# Patient Record
Sex: Female | Born: 1935 | Race: Black or African American | Hispanic: No | Smoking: Never smoker
Health system: Southern US, Community
[De-identification: ages and names within clinical notes are randomized; demographics above are authoritative.]

## PROBLEM LIST (undated history)

## (undated) DIAGNOSIS — E119 Type 2 diabetes mellitus without complications: Secondary | ICD-10-CM

## (undated) DIAGNOSIS — H409 Unspecified glaucoma: Secondary | ICD-10-CM

## (undated) DIAGNOSIS — I2510 Atherosclerotic heart disease of native coronary artery without angina pectoris: Secondary | ICD-10-CM

## (undated) DIAGNOSIS — N289 Disorder of kidney and ureter, unspecified: Secondary | ICD-10-CM

## (undated) DIAGNOSIS — I10 Essential (primary) hypertension: Secondary | ICD-10-CM

## (undated) HISTORY — PX: CORONARY ANGIOPLASTY WITH STENT PLACEMENT: SHX49

---

## 2006-12-22 ENCOUNTER — Ambulatory Visit: Payer: Self-pay | Admitting: Internal Medicine

## 2007-01-11 ENCOUNTER — Ambulatory Visit: Payer: Self-pay | Admitting: Internal Medicine

## 2007-02-10 ENCOUNTER — Ambulatory Visit: Payer: Self-pay | Admitting: Internal Medicine

## 2014-03-06 ENCOUNTER — Other Ambulatory Visit (HOSPITAL_COMMUNITY): Payer: Self-pay | Admitting: Nurse Practitioner

## 2014-03-06 DIAGNOSIS — R109 Unspecified abdominal pain: Secondary | ICD-10-CM

## 2014-03-06 DIAGNOSIS — K920 Hematemesis: Secondary | ICD-10-CM

## 2014-03-13 ENCOUNTER — Ambulatory Visit (HOSPITAL_COMMUNITY)
Admission: RE | Admit: 2014-03-13 | Discharge: 2014-03-13 | Disposition: A | Discharge status: Home / Self Care | Payer: Medicare Other | Source: Ambulatory Visit | Attending: Nurse Practitioner | Admitting: Nurse Practitioner

## 2014-03-13 DIAGNOSIS — N269 Renal sclerosis, unspecified: Secondary | ICD-10-CM | POA: Insufficient documentation

## 2014-03-13 DIAGNOSIS — K920 Hematemesis: Secondary | ICD-10-CM

## 2014-03-13 DIAGNOSIS — R109 Unspecified abdominal pain: Secondary | ICD-10-CM | POA: Insufficient documentation

## 2015-04-17 ENCOUNTER — Inpatient Hospital Stay
Admission: EM | Admit: 2015-04-17 | Discharge: 2015-04-20 | DRG: 392 | Disposition: A | Discharge status: Home / Self Care | Payer: Medicare Other | Attending: Internal Medicine | Admitting: Internal Medicine

## 2015-04-17 ENCOUNTER — Encounter: Payer: Self-pay | Admitting: Urgent Care

## 2015-04-17 ENCOUNTER — Emergency Department: Payer: Medicare Other

## 2015-04-17 DIAGNOSIS — I10 Essential (primary) hypertension: Secondary | ICD-10-CM | POA: Diagnosis present

## 2015-04-17 DIAGNOSIS — K559 Vascular disorder of intestine, unspecified: Secondary | ICD-10-CM | POA: Diagnosis not present

## 2015-04-17 DIAGNOSIS — Z79899 Other long term (current) drug therapy: Secondary | ICD-10-CM

## 2015-04-17 DIAGNOSIS — I2510 Atherosclerotic heart disease of native coronary artery without angina pectoris: Secondary | ICD-10-CM | POA: Diagnosis present

## 2015-04-17 DIAGNOSIS — K625 Hemorrhage of anus and rectum: Secondary | ICD-10-CM | POA: Diagnosis present

## 2015-04-17 DIAGNOSIS — K529 Noninfective gastroenteritis and colitis, unspecified: Principal | ICD-10-CM | POA: Diagnosis present

## 2015-04-17 DIAGNOSIS — K922 Gastrointestinal hemorrhage, unspecified: Secondary | ICD-10-CM

## 2015-04-17 DIAGNOSIS — N390 Urinary tract infection, site not specified: Secondary | ICD-10-CM | POA: Diagnosis present

## 2015-04-17 DIAGNOSIS — Z955 Presence of coronary angioplasty implant and graft: Secondary | ICD-10-CM

## 2015-04-17 DIAGNOSIS — Z7982 Long term (current) use of aspirin: Secondary | ICD-10-CM

## 2015-04-17 DIAGNOSIS — K5731 Diverticulosis of large intestine without perforation or abscess with bleeding: Secondary | ICD-10-CM

## 2015-04-17 DIAGNOSIS — N182 Chronic kidney disease, stage 2 (mild): Secondary | ICD-10-CM | POA: Diagnosis present

## 2015-04-17 DIAGNOSIS — Z882 Allergy status to sulfonamides status: Secondary | ICD-10-CM

## 2015-04-17 DIAGNOSIS — E119 Type 2 diabetes mellitus without complications: Secondary | ICD-10-CM | POA: Diagnosis present

## 2015-04-17 HISTORY — DX: Atherosclerotic heart disease of native coronary artery without angina pectoris: I25.10

## 2015-04-17 HISTORY — DX: Essential (primary) hypertension: I10

## 2015-04-17 HISTORY — DX: Type 2 diabetes mellitus without complications: E11.9

## 2015-04-17 HISTORY — DX: Disorder of kidney and ureter, unspecified: N28.9

## 2015-04-17 LAB — CBC WITH DIFFERENTIAL/PLATELET
Basophils Absolute: 0 10*3/uL (ref 0–0.1)
Basophils Relative: 0 %
EOS ABS: 0 10*3/uL (ref 0–0.7)
EOS PCT: 0 %
HCT: 40.2 % (ref 35.0–47.0)
Hemoglobin: 12.9 g/dL (ref 12.0–16.0)
LYMPHS PCT: 5 %
Lymphs Abs: 0.7 10*3/uL — ABNORMAL LOW (ref 1.0–3.6)
MCH: 27.8 pg (ref 26.0–34.0)
MCHC: 32.1 g/dL (ref 32.0–36.0)
MCV: 86.6 fL (ref 80.0–100.0)
MONO ABS: 0.3 10*3/uL (ref 0.2–0.9)
Monocytes Relative: 2 %
Neutro Abs: 13.4 10*3/uL — ABNORMAL HIGH (ref 1.4–6.5)
Neutrophils Relative %: 93 %
PLATELETS: 175 10*3/uL (ref 150–440)
RBC: 4.64 MIL/uL (ref 3.80–5.20)
RDW: 13.4 % (ref 11.5–14.5)
WBC: 14.5 10*3/uL — ABNORMAL HIGH (ref 3.6–11.0)

## 2015-04-17 LAB — COMPREHENSIVE METABOLIC PANEL
ALT: 32 U/L (ref 14–54)
ANION GAP: 10 (ref 5–15)
AST: 38 U/L (ref 15–41)
Albumin: 3.9 g/dL (ref 3.5–5.0)
Alkaline Phosphatase: 112 U/L (ref 38–126)
BUN: 31 mg/dL — AB (ref 6–20)
CALCIUM: 8.8 mg/dL — AB (ref 8.9–10.3)
CO2: 22 mmol/L (ref 22–32)
CREATININE: 1.55 mg/dL — AB (ref 0.44–1.00)
Chloride: 101 mmol/L (ref 101–111)
GFR, EST AFRICAN AMERICAN: 32 mL/min — AB (ref >60)
GFR, EST NON AFRICAN AMERICAN: 28 mL/min — AB (ref >60)
Glucose, Bld: 277 mg/dL — ABNORMAL HIGH (ref 65–99)
Potassium: 4.2 mmol/L (ref 3.5–5.1)
Sodium: 133 mmol/L — ABNORMAL LOW (ref 135–145)
Total Bilirubin: 0.6 mg/dL (ref 0.3–1.2)
Total Protein: 7.2 g/dL (ref 6.5–8.1)

## 2015-04-17 LAB — TYPE AND SCREEN
ABO/RH(D): O POS
ANTIBODY SCREEN: NEGATIVE

## 2015-04-17 LAB — URINALYSIS COMPLETE WITH MICROSCOPIC (ARMC ONLY)
Bilirubin Urine: NEGATIVE
Glucose, UA: 150 mg/dL — AB
Ketones, ur: NEGATIVE mg/dL
LEUKOCYTES UA: NEGATIVE
NITRITE: NEGATIVE
Protein, ur: 100 mg/dL — AB
Specific Gravity, Urine: 1.006 (ref 1.005–1.030)
Squamous Epithelial / LPF: NONE SEEN
pH: 5 (ref 5.0–8.0)

## 2015-04-17 LAB — LIPASE, BLOOD: Lipase: 33 U/L (ref 22–51)

## 2015-04-17 LAB — TROPONIN I: Troponin I: <0.03 ng/mL (ref <0.031)

## 2015-04-17 MED ORDER — IOHEXOL 240 MG/ML SOLN: 25.0000 mL | Freq: Once | INTRAMUSCULAR | Status: AC | PRN

## 2015-04-17 MED ORDER — SODIUM CHLORIDE 0.9 % IV BOLUS (SEPSIS)
500.0000 mL | Freq: Once | INTRAVENOUS | Status: AC
  Administered 2015-04-18: 500 mL via INTRAVENOUS

## 2015-04-17 MED ORDER — IOHEXOL 300 MG/ML  SOLN
80.0000 mL | Freq: Once | INTRAMUSCULAR | Status: AC | PRN
  Administered 2015-04-17: 80 mL via INTRAVENOUS

## 2015-04-17 NOTE — ED Notes (Signed)
Patient presents with reports of non-specific abdominal pain and passing BRBPR today. Went to [facility], however was not seen due to wait times.

## 2015-04-17 NOTE — ED Provider Notes (Signed)
[facility] Emergency Department Provider Note  ____________________________________________  Time seen: Approximately 10:03 PM  I have reviewed the triage vital signs and the nursing notes.   HISTORY  Chief Complaint Rectal Bleeding and Abdominal Pain    HPI Sonia Simpson is a 81 y.o. female with history of coronary artery disease status post stent on aspirin and Plavix, hypertension, diabetes who presents for evaluation of bright red blood per rectum today, sudden onset, constant. Patient reports that she has seen blood from her rectum at least  8 times today, sometimes mixed with stool and sometimes without stool. Initially she had some lower abdominal cramping this morning but this has resolved. She has had no nausea, vomiting, blood in her vomit, no chest pain or difficulty breathing. Current severity is 8 out of 10. No modifying factors. She has had bleeding from her rectum in the past which was thought to be secondary to polyps.   Past Medical History  Diagnosis Date  . Hypertension   . Diabetes mellitus without complication   . Renal disorder     There are no active problems to display for this patient.   Past Surgical History  Procedure Laterality Date  . Coronary angioplasty with stent placement      2009    No current outpatient prescriptions on file.  Allergies Sulfa antibiotics  No family history on file.  Social History History  Substance Use Topics  . Smoking status: Never Smoker   . Smokeless tobacco: Not on file  . Alcohol Use: No    Review of Systems Constitutional: No fever/chills Eyes: No visual changes. ENT: No sore throat. Cardiovascular: Denies chest pain. Respiratory: Denies shortness of breath. Gastrointestinal: + abdominal cramping.  No nausea, no vomiting.  No diarrhea.  No constipation. Genitourinary: Negative for dysuria. Musculoskeletal: Negative for back pain. Skin: Negative for rash. Neurological:  Negative for headaches, focal weakness or numbness.  10-point ROS otherwise negative.  ____________________________________________   PHYSICAL EXAM:  VITAL SIGNS: ED Triage Vitals  Enc Vitals Group     BP 04/17/15 2055 215/82 mmHg     Pulse Rate 04/17/15 2055 86     Resp 04/17/15 2055 18     Temp 04/17/15 2055 98.3 F (36.8 C)     Temp Source 04/17/15 2055 Oral     SpO2 04/17/15 2055 98 %     Weight 04/17/15 2055 183 lb (83.008 kg)     Height 04/17/15 2055 5\' 8"  (1.727 m)     Head Cir --      Peak Flow --      Pain Score 04/17/15 2056 7     Pain Loc --      Pain Edu? --      Excl. in [place]? --     Constitutional: Alert and oriented. Well appearing and in no acute distress. Eyes: Conjunctivae are normal. PERRL. EOMI. Head: Atraumatic. Nose: No congestion/rhinnorhea. Mouth/Throat: Mucous membranes are moist.  Oropharynx non-erythematous. Neck: No stridor.  Cardiovascular: Normal rate, regular rhythm. Grossly normal heart sounds.  Good peripheral circulation. Respiratory: Normal respiratory effort.  No retractions. Lungs CTAB. Gastrointestinal: Soft with mild lower abdominal tenderness. No distention. No abdominal bruits. No CVA tenderness. Rectal: bright red blood in rectal vault which is strongly guaiac positive Genitourinary: deferred Musculoskeletal: No lower extremity tenderness nor edema.  No joint effusions. Neurologic:  Normal speech and language. No gross focal neurologic deficits are appreciated. Speech is normal. No gait instability. Skin:  Skin is warm,  dry and intact. No rash noted. Psychiatric: Mood and affect are normal. Speech and behavior are normal.  ____________________________________________   LABS (all labs ordered are listed, but only abnormal results are displayed)  Labs Reviewed  CBC WITH DIFFERENTIAL/PLATELET - Abnormal; Notable for the following:    WBC 14.5 (*)    Neutro Abs 13.4 (*)    Lymphs Abs 0.7 (*)    All other components within  normal limits  COMPREHENSIVE METABOLIC PANEL - Abnormal; Notable for the following:    Sodium 133 (*)    Glucose, Bld 277 (*)    BUN 31 (*)    Creatinine, Ser 1.55 (*)    Calcium 8.8 (*)    GFR calc non Af Amer 28 (*)    GFR calc Af Amer 32 (*)    All other components within normal limits  APTT - Abnormal; Notable for the following:    aPTT 23 (*)    All other components within normal limits  LIPASE, BLOOD  TROPONIN I  PROTIME-INR  URINALYSIS COMPLETEWITH MICROSCOPIC ([facility] ONLY)  TYPE AND SCREEN   ____________________________________________  EKG  ED ECG REPORT I, Joanne Gavel, the attending physician, personally viewed and interpreted this ECG.   Date: 04/17/2015  EKG Time: 22:34  Rate: 82  Rhythm: normal sinus rhythm  Axis: normal  Intervals:none  ST&T Change: No acute ST segment elevation, Q waves in lead 3, aVF, V2, V3  ____________________________________________  RADIOLOGY  CT abdomen and pelvis pending ____________________________________________   PROCEDURES  Procedure(s) performed: None  Critical Care performed: No  ____________________________________________   INITIAL IMPRESSION / ASSESSMENT AND PLAN / ED COURSE  Pertinent labs & imaging results that were available during my care of the patient were reviewed by me and considered in my medical decision making (see chart for details).  Sonia Simpson is a 81 y.o. female with history of coronary artery disease status post stent on aspirin and Plavix, hypertension, diabetes who presents for evaluation of bright red blood per rectum. On exam, she is generally well-appearing and in no acute distress. She is mild hypertensive on arrival but vital signs otherwise stable, she is afebrile. She has mild lower abdominal tenderness, normal bowel sounds however she does have active bright red blood in her rectal vault concerning for active lower GI bleed. Plan for basic labs, UA, anticipate  admission.  ----------------------------------------- 10:43 PM on 04/17/2015 -----------------------------------------  Hemoglobin stable but given that the most likely cause of the patient's lower GI brief bleed is diverticular given age, anticipate admission given ongoing bleeding. Given her lower abdominal tenderness and leukocytosis, we'll obtain CT of the abdomen and pelvis at this time. IV fluids infusing.  ----------------------------------------- 11:12 PM on 04/17/2015 -----------------------------------------  Care transferred to Dr. Lovena Le at this time. Anticipate admission. ____________________________________________   FINAL CLINICAL IMPRESSION(S) / ED DIAGNOSES  Final diagnoses:  Lower GI bleed      Joanne Gavel, MD 04/17/15 2312

## 2015-04-18 ENCOUNTER — Encounter: Payer: Self-pay | Admitting: Internal Medicine

## 2015-04-18 DIAGNOSIS — E119 Type 2 diabetes mellitus without complications: Secondary | ICD-10-CM

## 2015-04-18 DIAGNOSIS — Z955 Presence of coronary angioplasty implant and graft: Secondary | ICD-10-CM | POA: Diagnosis not present

## 2015-04-18 DIAGNOSIS — Z882 Allergy status to sulfonamides status: Secondary | ICD-10-CM | POA: Diagnosis not present

## 2015-04-18 DIAGNOSIS — I10 Essential (primary) hypertension: Secondary | ICD-10-CM | POA: Diagnosis present

## 2015-04-18 DIAGNOSIS — N390 Urinary tract infection, site not specified: Secondary | ICD-10-CM | POA: Diagnosis present

## 2015-04-18 DIAGNOSIS — K529 Noninfective gastroenteritis and colitis, unspecified: Secondary | ICD-10-CM | POA: Diagnosis present

## 2015-04-18 DIAGNOSIS — Z79899 Other long term (current) drug therapy: Secondary | ICD-10-CM | POA: Diagnosis not present

## 2015-04-18 DIAGNOSIS — Z7982 Long term (current) use of aspirin: Secondary | ICD-10-CM | POA: Diagnosis not present

## 2015-04-18 DIAGNOSIS — I2510 Atherosclerotic heart disease of native coronary artery without angina pectoris: Secondary | ICD-10-CM

## 2015-04-18 DIAGNOSIS — K625 Hemorrhage of anus and rectum: Secondary | ICD-10-CM | POA: Diagnosis not present

## 2015-04-18 DIAGNOSIS — N182 Chronic kidney disease, stage 2 (mild): Secondary | ICD-10-CM | POA: Diagnosis present

## 2015-04-18 DIAGNOSIS — K559 Vascular disorder of intestine, unspecified: Secondary | ICD-10-CM | POA: Diagnosis present

## 2015-04-18 LAB — CBC
HCT: 37.6 % (ref 35.0–47.0)
HCT: 34.3 % — ABNORMAL LOW (ref 35.0–47.0)
HEMATOCRIT: 34 % — AB (ref 35.0–47.0)
Hemoglobin: 12 g/dL (ref 12.0–16.0)
Hemoglobin: 10.9 g/dL — ABNORMAL LOW (ref 12.0–16.0)
Hemoglobin: 10.9 g/dL — ABNORMAL LOW (ref 12.0–16.0)
MCH: 27.6 pg (ref 26.0–34.0)
MCH: 27.8 pg (ref 26.0–34.0)
MCH: 27.8 pg (ref 26.0–34.0)
MCHC: 31.9 g/dL — ABNORMAL LOW (ref 32.0–36.0)
MCHC: 31.7 g/dL — ABNORMAL LOW (ref 32.0–36.0)
MCHC: 32.1 g/dL (ref 32.0–36.0)
MCV: 86.7 fL (ref 80.0–100.0)
MCV: 87.7 fL (ref 80.0–100.0)
MCV: 86.8 fL (ref 80.0–100.0)
PLATELETS: 141 10*3/uL — AB (ref 150–440)
PLATELETS: 129 10*3/uL — AB (ref 150–440)
Platelets: 118 10*3/uL — ABNORMAL LOW (ref 150–440)
RBC: 4.34 MIL/uL (ref 3.80–5.20)
RBC: 3.91 MIL/uL (ref 3.80–5.20)
RBC: 3.91 MIL/uL (ref 3.80–5.20)
RDW: 13.6 % (ref 11.5–14.5)
RDW: 13.6 % (ref 11.5–14.5)
RDW: 13.6 % (ref 11.5–14.5)
WBC: 13.6 10*3/uL — AB (ref 3.6–11.0)
WBC: 12 10*3/uL — ABNORMAL HIGH (ref 3.6–11.0)
WBC: 12.4 10*3/uL — ABNORMAL HIGH (ref 3.6–11.0)

## 2015-04-18 LAB — GLUCOSE, CAPILLARY
Glucose-Capillary: 139 mg/dL — ABNORMAL HIGH (ref 65–99)
Glucose-Capillary: 183 mg/dL — ABNORMAL HIGH (ref 65–99)
Glucose-Capillary: 234 mg/dL — ABNORMAL HIGH (ref 65–99)
Glucose-Capillary: 238 mg/dL — ABNORMAL HIGH (ref 65–99)
Glucose-Capillary: 277 mg/dL — ABNORMAL HIGH (ref 65–99)
Glucose-Capillary: 295 mg/dL — ABNORMAL HIGH (ref 65–99)

## 2015-04-18 LAB — HEMOGLOBIN AND HEMATOCRIT, BLOOD
HEMATOCRIT: 33.8 % — AB (ref 35.0–47.0)
HEMOGLOBIN: 10.7 g/dL — AB (ref 12.0–16.0)

## 2015-04-18 LAB — BASIC METABOLIC PANEL
ANION GAP: 9 (ref 5–15)
BUN: 27 mg/dL — AB (ref 6–20)
CO2: 21 mmol/L — ABNORMAL LOW (ref 22–32)
CREATININE: 1.39 mg/dL — AB (ref 0.44–1.00)
Calcium: 8.5 mg/dL — ABNORMAL LOW (ref 8.9–10.3)
Chloride: 105 mmol/L (ref 101–111)
GFR calc Af Amer: 37 mL/min — ABNORMAL LOW (ref >60)
GFR calc non Af Amer: 32 mL/min — ABNORMAL LOW (ref >60)
Glucose, Bld: 277 mg/dL — ABNORMAL HIGH (ref 65–99)
Potassium: 4 mmol/L (ref 3.5–5.1)
Sodium: 135 mmol/L (ref 135–145)

## 2015-04-18 LAB — PROTIME-INR
INR: 0.97 (ref 0.00–1.49)
PROTHROMBIN TIME: 13.1 s (ref 11.6–15.2)

## 2015-04-18 LAB — ABO/RH: ABO/RH(D): O POS

## 2015-04-18 LAB — APTT: APTT: 23 s — AB (ref 24–37)

## 2015-04-18 LAB — LACTIC ACID, PLASMA: LACTIC ACID, VENOUS: 1 mmol/L (ref 0.5–2.0)

## 2015-04-18 MED ORDER — HYDRALAZINE HCL 25 MG PO TABS
25.0000 mg | ORAL_TABLET | Freq: Three times a day (TID) | ORAL | Status: DC
  Administered 2015-04-18 – 2015-04-20 (×5): 25 mg via ORAL
  Filled 2015-04-18 (×5): qty 1

## 2015-04-18 MED ORDER — CIPROFLOXACIN IN D5W 400 MG/200ML IV SOLN
400.0000 mg | INTRAVENOUS | Status: DC
  Administered 2015-04-19: 400 mg via INTRAVENOUS
  Filled 2015-04-18 (×2): qty 200

## 2015-04-18 MED ORDER — CIPROFLOXACIN IN D5W 400 MG/200ML IV SOLN
400.0000 mg | Freq: Once | INTRAVENOUS | Status: AC
Start: 2015-04-18 — End: 2015-04-18
  Administered 2015-04-18: 400 mg via INTRAVENOUS

## 2015-04-18 MED ORDER — METRONIDAZOLE IN NACL 5-0.79 MG/ML-% IV SOLN
500.0000 mg | Freq: Three times a day (TID) | INTRAVENOUS | Status: DC
  Administered 2015-04-18 – 2015-04-19 (×5): 500 mg via INTRAVENOUS
  Filled 2015-04-18 (×8): qty 100

## 2015-04-18 MED ORDER — HYDRALAZINE HCL 20 MG/ML IJ SOLN
10.0000 mg | Freq: Once | INTRAMUSCULAR | Status: AC
  Administered 2015-04-18: 10 mg via INTRAVENOUS
  Filled 2015-04-18: qty 1

## 2015-04-18 MED ORDER — PANTOPRAZOLE SODIUM 40 MG IV SOLR
40.0000 mg | Freq: Two times a day (BID) | INTRAVENOUS | Status: DC
Start: 2015-04-18 — End: 2015-04-20
  Administered 2015-04-18 – 2015-04-20 (×5): 40 mg via INTRAVENOUS
  Filled 2015-04-18 (×5): qty 40

## 2015-04-18 MED ORDER — AMLODIPINE BESYLATE 5 MG PO TABS
ORAL_TABLET | ORAL | Status: AC
  Administered 2015-04-18: 5 mg
  Filled 2015-04-18: qty 1

## 2015-04-18 MED ORDER — CIPROFLOXACIN IN D5W 400 MG/200ML IV SOLN
INTRAVENOUS | Status: AC
  Administered 2015-04-18: 400 mg via INTRAVENOUS
  Filled 2015-04-18: qty 200

## 2015-04-18 MED ORDER — INSULIN GLARGINE 100 UNIT/ML ~~LOC~~ SOLN
8.0000 [IU] | Freq: Every day | SUBCUTANEOUS | Status: DC
  Administered 2015-04-18 – 2015-04-20 (×3): 8 [IU] via SUBCUTANEOUS
  Filled 2015-04-18 (×4): qty 0.08

## 2015-04-18 MED ORDER — INSULIN ASPART 100 UNIT/ML ~~LOC~~ SOLN
0.0000 [IU] | SUBCUTANEOUS | Status: DC
  Administered 2015-04-18: 5 [IU] via SUBCUTANEOUS
  Administered 2015-04-18: 8 [IU] via SUBCUTANEOUS
  Administered 2015-04-18: 3 [IU] via SUBCUTANEOUS
  Administered 2015-04-18: 8 [IU] via SUBCUTANEOUS
  Administered 2015-04-18: 5 [IU] via SUBCUTANEOUS
  Administered 2015-04-19: 3 [IU] via SUBCUTANEOUS
  Administered 2015-04-19: 5 [IU] via SUBCUTANEOUS
  Administered 2015-04-19: 8 [IU] via SUBCUTANEOUS
  Administered 2015-04-19: 2 [IU] via SUBCUTANEOUS
  Administered 2015-04-20: 3 [IU] via SUBCUTANEOUS
  Filled 2015-04-18 (×2): qty 8
  Filled 2015-04-18: qty 3
  Filled 2015-04-18: qty 5
  Filled 2015-04-18: qty 8
  Filled 2015-04-18 (×2): qty 5
  Filled 2015-04-18: qty 3
  Filled 2015-04-18: qty 2
  Filled 2015-04-18: qty 3

## 2015-04-18 MED ORDER — METRONIDAZOLE IN NACL 5-0.79 MG/ML-% IV SOLN: 500.0000 mg | Freq: Once | INTRAVENOUS | Status: DC

## 2015-04-18 MED ORDER — ONDANSETRON HCL 4 MG PO TABS: 4.0000 mg | ORAL_TABLET | Freq: Four times a day (QID) | ORAL | Status: DC | PRN

## 2015-04-18 MED ORDER — ONDANSETRON HCL 4 MG/2ML IJ SOLN: 4.0000 mg | Freq: Four times a day (QID) | INTRAMUSCULAR | Status: DC | PRN

## 2015-04-18 NOTE — ED Provider Notes (Addendum)
-----------------------------------------   12:36 AM on 04/18/2015 -----------------------------------------  Patient's CT scan showed colitis which was more favorable of ischemic versus infectious colitis as well as she had extensive diverticulosis. Dr. Reece Levy the hospitalist is going to admit the patient. He did not recommend any particular antibiotics at this time. Patient is still hemodynamically stable.  Ct Abdomen Pelvis W Contrast  04/18/2015   CLINICAL DATA:  81 year old female with abdominal pain and GI bleeding. No indication of diarrhea.  EXAM: CT ABDOMEN AND PELVIS WITH CONTRAST  TECHNIQUE: Multidetector CT imaging of the abdomen and pelvis was performed using the standard protocol following bolus administration of intravenous contrast.  CONTRAST:  31mL OMNIPAQUE IOHEXOL 300 MG/ML  SOLN  COMPARISON:  None.  FINDINGS: Motion degraded study, but overall diagnostic.  BODY WALL: No contributory findings.  LOWER CHEST: No contributory findings.  ABDOMEN/PELVIS:  Liver: No focal abnormality.  Biliary: No evidence of biliary obstruction or stone.  Pancreas: Unremarkable.  Spleen: Unremarkable.  Adrenals: Unremarkable.  Kidneys and ureters: No hydronephrosis or stone.  Bladder: Unremarkable.  Reproductive: 45 mm calcified uterine fibroid.  No adnexal mass.  Bowel: There is circumferential low-density thickening of the colon centered at the level of the splenic flexure with surrounding fat infiltration. No associated obstruction or perforation. Extensive distal colonic diverticulosis. No acute vascular findings such as dissection or major vessel occlusion. No high-grade proximal mesenteric stenosis. Portal venous system is patent.  Retroperitoneum: No mass or adenopathy.  Peritoneum: No ascites or pneumoperitoneum.  Vascular: Atherosclerosis without acute arterial finding or proximal mesenteric stenosis.  OSSEOUS: No acute abnormalities. 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  IMPRESSION:  1. Colitis centered at the splenic flexure, pattern and history favoring ischemic over infectious colitis. 2. Atherosclerosis without acute vascular finding or proximal mesenteric stenosis.   Electronically Signed   By: Monte Fantasia M.D.   On: 04/18/2015 00:03     Sonia Cola, Sonia Simpson 04/18/15 0037  ----------------------------------------- 12:56 AM on 04/18/2015 -----------------------------------------  Patient was started on IV Cipro and Flagyl for her colitis as well as she was found to have a urinary tract infection. Dr. Rexene Edison was consulted to evaluate the patient for questionable ischemic colitis.  Sonia Cola, Sonia Simpson 04/18/15 [phone]

## 2015-04-18 NOTE — Care Management (Signed)
Spoke with patient and niece  who was present in the room. Patient is alert and oriented and stated that the niece and her son help at home . Patient stated that she uses a cane at home but that she also has a walker. She has had home health prior for broken ankle and thinks it was through health department. Patient stated that the son works across the street form her home.  She denies any difficulty at home other than hip pain. Niece confirms. Would recommend to MD PT evaluation when patient is ready due to age and living alone.  No CM needs anticipated at this time. Continue to follow for changing needs.

## 2015-04-18 NOTE — ED Notes (Signed)
Pt up to bathroom with assistance.  Surgeon in with pt   Iv fluids infusing.

## 2015-04-18 NOTE — H&P (Signed)
[facility] [place] NAME: Sonia Simpson    MR#:  [id number]  DATE OF BIRTH:  [birth year]  DATE OF ADMISSION:  04/17/2015  PRIMARY CARE PHYSICIAN: No primary care provider on file. [facility]  REQUESTING/REFERRING PHYSICIAN: Dr. Lovena Le  CHIEF COMPLAINT:   Chief Complaint  Patient presents with  . Rectal Bleeding  . Abdominal Pain    HISTORY OF PRESENT ILLNESS:  Sonia Simpson  is a 81 y.o. female with a known history of hypertension, coronary artery disease, diabetes mellitus type 2 CK D stage 2 presents to the emergency room with the complaints of episodes of bright red blood per rectum 8 since this morning. Associated crampy abdominal pain present but no diarrhea, no vomitings, no hematemesis. Denies any fever, chills, shortness of breath, chest pain, dizziness. Also denies any dysuria, frequency, urgency of urination. In the emergency room patient was evaluated by the ED physician and was found to be stable hemodynamically and workup revealed WBC of 14.5, H&H of 12.9 over 40.2. CT of the abdomen and the pelvis obtained revealed colitis in the splenic flexure favoring ischemic over infectious etiology and evidence of atherosclerosis. Urinalysis unremarkable for UTI. Patient was started on Flagyl and ciprofloxacin and surgery on call was consulted by the ED physician because of possible to ischemic colitis who recommended admission to the medicine and surgery will consult later. Hospitalist service was consulted for further management. Patient is currently comfortable lying in the bed, denies any complaints. She states that she had episodes of bright red blood per rectum last year for which she underwent flex sigmoidoscopy at [facility] and was diagnosed to have polyps. Patient did not have any further lower GI bleed since last year up until today.  PAST MEDICAL HISTORY:   Past Medical History  Diagnosis Date  . Hypertension   . Diabetes  mellitus without complication   . Renal disorder   . Coronary artery disease     PAST SURGICAL HISTORY:   Past Surgical History  Procedure Laterality Date  . Coronary angioplasty with stent placement      2009    SOCIAL HISTORY:   History  Substance Use Topics  . Smoking status: Never Smoker   . Smokeless tobacco: Not on file  . Alcohol Use: No    FAMILY HISTORY:   Family History  Problem Relation Age of Onset  . Diabetes Mellitus II Mother   . Hypertension Father   . Diabetes type II Sister   . Diabetes type II Brother     DRUG ALLERGIES:   Allergies  Allergen Reactions  . Sulfa Antibiotics     REVIEW OF SYSTEMS:   Review of Systems  Constitutional: Negative for fever, chills and malaise/fatigue.  HENT: Negative for ear pain, hearing loss, nosebleeds, sore throat and tinnitus.   Eyes: Negative for blurred vision, double vision, pain, discharge and redness.  Respiratory: Negative for cough, hemoptysis, sputum production, shortness of breath and wheezing.   Cardiovascular: Negative for chest pain, palpitations, orthopnea and leg swelling.  Gastrointestinal: Positive for abdominal pain and blood in stool. Negative for nausea, vomiting, diarrhea, constipation and melena.       Bright red blood per rectum as noted in history of present illness. Mild crampy abdominal pain +.  Genitourinary: Negative for dysuria, urgency, frequency and hematuria.  Musculoskeletal: Negative for back pain, joint pain and neck pain.  Skin: Negative for itching and rash.  Neurological: Negative for dizziness, tingling, sensory change,  focal weakness and seizures.  Endo/Heme/Allergies: Does not bruise/bleed easily.  Psychiatric/Behavioral: Negative for depression. The patient is not nervous/anxious.     MEDICATIONS AT HOME:   Prior to Admission medications   Not on File      VITAL SIGNS:  Blood pressure 210/83, pulse 86, temperature 98.3 F (36.8 C), temperature source Oral,  resp. rate 18, height 5\' 8"  (1.727 m), weight 83.008 kg (183 lb), SpO2 97 %.  PHYSICAL EXAMINATION:  Physical Exam  Constitutional: She is oriented to person, place, and time. She appears well-developed and well-nourished. No distress.  HENT:  Head: Normocephalic and atraumatic.  Right Ear: External ear normal.  Left Ear: External ear normal.  Nose: Nose normal.  Mouth/Throat: Oropharynx is clear and moist. No oropharyngeal exudate.  Eyes: EOM are normal. Pupils are equal, round, and reactive to light. No scleral icterus.  Neck: Normal range of motion. Neck supple. No JVD present. No thyromegaly present.  Cardiovascular: Normal rate, regular rhythm, normal heart sounds and intact distal pulses.  Exam reveals no friction rub.   No murmur heard. Respiratory: Effort normal and breath sounds normal. No respiratory distress. She has no wheezes. She has no rales. She exhibits no tenderness.  GI: Soft. Bowel sounds are normal. She exhibits no distension and no mass. There is no tenderness. There is no rebound and no guarding.  Musculoskeletal: Normal range of motion. She exhibits no edema.  Lymphadenopathy:    She has no cervical adenopathy.  Neurological: She is alert and oriented to person, place, and time. She has normal reflexes. She displays normal reflexes. No cranial nerve deficit. She exhibits normal muscle tone.  Skin: Skin is warm. No rash noted. No erythema.  Psychiatric: She has a normal mood and affect. Her behavior is normal. Thought content normal.   LABORATORY PANEL:   CBC  Recent Labs Lab 04/17/15 2201  WBC 14.5*  HGB 12.9  HCT 40.2  PLT 175   ------------------------------------------------------------------------------------------------------------------  Chemistries   Recent Labs Lab 04/17/15 2201  NA 133*  K 4.2  CL 101  CO2 22  GLUCOSE 277*  BUN 31*  CREATININE 1.55*  CALCIUM 8.8*  AST 38  ALT 32  ALKPHOS 112  BILITOT 0.6    ------------------------------------------------------------------------------------------------------------------  Cardiac Enzymes  Recent Labs Lab 04/17/15 2201  TROPONINI <0.03   ------------------------------------------------------------------------------------------------------------------  RADIOLOGY:  Ct Abdomen Pelvis W Contrast  04/18/2015   CLINICAL DATA:  81 year old female with abdominal pain and GI bleeding. No indication of diarrhea.  EXAM: CT ABDOMEN AND PELVIS WITH CONTRAST  TECHNIQUE: Multidetector CT imaging of the abdomen and pelvis was performed using the standard protocol following bolus administration of intravenous contrast.  CONTRAST:  78mL OMNIPAQUE IOHEXOL 300 MG/ML  SOLN  COMPARISON:  None.  FINDINGS: Motion degraded study, but overall diagnostic.  BODY WALL: No contributory findings.  LOWER CHEST: No contributory findings.  ABDOMEN/PELVIS:  Liver: No focal abnormality.  Biliary: No evidence of biliary obstruction or stone.  Pancreas: Unremarkable.  Spleen: Unremarkable.  Adrenals: Unremarkable.  Kidneys and ureters: No hydronephrosis or stone.  Bladder: Unremarkable.  Reproductive: 45 mm calcified uterine fibroid.  No adnexal mass.  Bowel: There is circumferential low-density thickening of the colon centered at the level of the splenic flexure with surrounding fat infiltration. No associated obstruction or perforation. Extensive distal colonic diverticulosis. No acute vascular findings such as dissection or major vessel occlusion. No high-grade proximal mesenteric stenosis. Portal venous system is patent.  Retroperitoneum: No mass or adenopathy.  Peritoneum: No ascites  or pneumoperitoneum.  Vascular: Atherosclerosis without acute arterial finding or proximal mesenteric stenosis.  OSSEOUS: No acute abnormalities. 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  IMPRESSION: 1. Colitis centered at the splenic flexure, pattern and history favoring ischemic over  infectious colitis. 2. Atherosclerosis without acute vascular finding or proximal mesenteric stenosis.   Electronically Signed   By: Monte Fantasia M.D.   On: 04/18/2015 00:03    EKG:   Orders placed or performed during the hospital encounter of 04/17/15  . ED EKG normal sinus rhythm with ventricular RATE of 82 bpm.   . ED EKG    IMPRESSION AND PLAN:   1. Bright red blood per rectum. 2. Colitis-CT abdomen favors ischemic over infectious. Discussed with the radiologist who opined nonobstructive ischemic etiology. 3. UTI 4. Hypertension, systolic blood pressure mild high. Patient stable. 5. Diabetes mellitus as type II on insulin stable. 6. History of coronary artery disease status post stent, stable, no acute problems. 7. CK D stage II, stable.  Plan: Admit to MedSurg, nothing by mouth except meds, type and screen 2 units of PRBC, monitor serial H&H, IV Protonix, GI consultation requested. Follow-up on surgical consultation which is pending at this time.  -Continue Cipro and Flagyl for now, follow-up urine cultures. -Hold Lantus for now, sliding scale insulin, follow blood sugars. -Continue current antihypertensive medications, follow-up BP measurements. -We'll hold aspirin and Plavix for now because of acute GI bleed. Monitor.    All the records are reviewed and case discussed with ED provider. Management plans discussed with the patient, family and they are in agreement.  CODE STATUS: Full code  TOTAL TIME TAKING CARE OF THIS PATIENT: 50 minutes.    Azucena Freed N M.D on 04/18/2015 at 1:25 AM  Between 7am to 6pm - Pager - [phone]  After 6pm go to [email or web address] - password [email or web address]  [facility]  Office  [phone]  CC: Primary care physician; No primary care provider on file.

## 2015-04-18 NOTE — Progress Notes (Signed)
[facility] [facility] [place] NAME: Taramarie Schnake    MR#:  [id number]  DATE OF BIRTH:  December 14, 1920  SUBJECTIVE:  CHIEF COMPLAINT:  Resting comfortably. Patient reported another episode of bright red blood per rectum today at around 5 AM. Remote history of hemorrhoids in the past  REVIEW OF SYSTEMS:  CONSTITUTIONAL: No fever, fatigue .reporting wekness.  EYES: No blurred or double vision.  EARS, NOSE, AND THROAT: No tinnitus or ear pain.  RESPIRATORY: No cough, shortness of breath, wheezing or hemoptysis.  CARDIOVASCULAR: No chest pain, orthopnea, edema.  GASTROINTESTINAL: No nausea, vomiting, diarrhea or abdominal pain.  GENITOURINARY: No dysuria, hematuria.  ENDOCRINE: No polyuria, nocturia,  HEMATOLOGY: No anemia, easy bruising or bleeding SKIN: No rash or lesion. MUSCULOSKELETAL: No joint pain or arthritis.   NEUROLOGIC: No tingling, numbness, weakness.  PSYCHIATRY: No anxiety or depression.   DRUG ALLERGIES:   Allergies  Allergen Reactions  . Sulfa Antibiotics     VITALS:  Blood pressure 164/57, pulse 79, temperature 97.9 F (36.6 C), temperature source Oral, resp. rate 17, height 5\' 9"  (1.753 m), weight 83.19 kg (183 lb 6.4 oz), SpO2 98 %.  PHYSICAL EXAMINATION:  GENERAL:  81 y.o.-year-old patient lying in the bed with no acute distress.  EYES: Pupils equal, round, reactive to light and accommodation. No scleral icterus. Extraocular muscles intact.  HEENT: Head atraumatic, normocephalic. Oropharynx and nasopharynx clear.  NECK:  Supple, no jugular venous distention. No thyroid enlargement, no tenderness.  LUNGS: Normal breath sounds bilaterally, no wheezing, rales,rhonchi or crepitation. No use of accessory muscles of respiration.  CARDIOVASCULAR: S1, S2 normal. No murmurs, rubs, or gallops.  ABDOMEN: Soft, nontender, nondistended. Bowel sounds present. No organomegaly or mass.  EXTREMITIES: No pedal edema, cyanosis, or  clubbing.  NEUROLOGIC: Cranial nerves II through XII are intact. Muscle strength 5/5 in all extremities. Sensation intact. Gait not checked.  PSYCHIATRIC: The patient is alert and oriented x 3.  SKIN: No obvious rash, lesion, or ulcer.    LABORATORY PANEL:   CBC  Recent Labs Lab 04/18/15 0452  WBC 13.6*  HGB 12.0  HCT 37.6  PLT 141*   ------------------------------------------------------------------------------------------------------------------  Chemistries   Recent Labs Lab 04/17/15 2201 04/18/15 0452  NA 133* 135  K 4.2 4.0  CL 101 105  CO2 22 21*  GLUCOSE 277* 277*  BUN 31* 27*  CREATININE 1.55* 1.39*  CALCIUM 8.8* 8.5*  AST 38  --   ALT 32  --   ALKPHOS 112  --   BILITOT 0.6  --    ------------------------------------------------------------------------------------------------------------------  Cardiac Enzymes  Recent Labs Lab 04/17/15 2201  TROPONINI <0.03   ------------------------------------------------------------------------------------------------------------------  RADIOLOGY:  Ct Abdomen Pelvis W Contrast  04/18/2015   CLINICAL DATA:  81 year old female with abdominal pain and GI bleeding. No indication of diarrhea.  EXAM: CT ABDOMEN AND PELVIS WITH CONTRAST  TECHNIQUE: Multidetector CT imaging of the abdomen and pelvis was performed using the standard protocol following bolus administration of intravenous contrast.  CONTRAST:  59mL OMNIPAQUE IOHEXOL 300 MG/ML  SOLN  COMPARISON:  None.  FINDINGS: Motion degraded study, but overall diagnostic.  BODY WALL: No contributory findings.  LOWER CHEST: No contributory findings.  ABDOMEN/PELVIS:  Liver: No focal abnormality.  Biliary: No evidence of biliary obstruction or stone.  Pancreas: Unremarkable.  Spleen: Unremarkable.  Adrenals: Unremarkable.  Kidneys and ureters: No hydronephrosis or stone.  Bladder: Unremarkable.  Reproductive: 45 mm calcified uterine fibroid.  No adnexal mass.  Bowel:  There is  circumferential low-density thickening of the colon centered at the level of the splenic flexure with surrounding fat infiltration. No associated obstruction or perforation. Extensive distal colonic diverticulosis. No acute vascular findings such as dissection or major vessel occlusion. No high-grade proximal mesenteric stenosis. Portal venous system is patent.  Retroperitoneum: No mass or adenopathy.  Peritoneum: No ascites or pneumoperitoneum.  Vascular: Atherosclerosis without acute arterial finding or proximal mesenteric stenosis.  OSSEOUS: No acute abnormalities. 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  IMPRESSION: 1. Colitis centered at the splenic flexure, pattern and history favoring ischemic over infectious colitis. 2. Atherosclerosis without acute vascular finding or proximal mesenteric stenosis.   Electronically Signed   By: Monte Fantasia M.D.   On: 04/18/2015 00:03    EKG:   Orders placed or performed during the hospital encounter of 04/17/15  . EKG 12-Lead  . EKG 12-Lead    ASSESSMENT AND PLAN:  #. Bright red blood per rectum probably secondary to Colitis-CT abdomen favors ischemic over infectious. Admitting physician has discussed with the radiologist who opined nonobstructive ischemic etiology. Status post 2 units of PRBC.  monitor serial H&H, IV Protonix,  GI consultation requested, discussed with Dr. Jenetta Downer h Follow-up on surgical consultation which is pending at this time.  Clear liquid diet Antibiotics We'll hold aspirin and Plavix for now because of acute GI bleed. Monitor.   #. UTI- on empiric IV antibiotics follow up culture results, #. Hypertension, systolic blood pressure high.  hydralazine is added , titrate as needed basis  #. Diabetes mellitus as type II on insulin. Patient takes 22 units of Lantus subcutaneous nightly daily at bedtime. Currently patient being on clear liquid diet will provide 8 units of Lantus  #. History of coronary artery  disease status post stent, stable, no acute problems.  #CK D stage II, stable.      All the records are reviewed and case discussed with Care Management/Social Workerr. Management plans discussed with the patient, family and they are in agreement.  CODE STATUS: Full code  TOTAL TIME TAKING CARE OF THIS PATIENT: 35 minutes.   POSSIBLE D/C IN 1-2 DAYS, DEPENDING ON CLINICAL CONDITION.   Nicholes Mango M.D on 04/18/2015 at 2:23 PM  Between 7am to 6pm - Pager - [phone] After 6pm go to [email or web address] - password [email or web address] [facility]  [facility]  Office  [phone]  CC: Primary care physician; No primary care provider on file.

## 2015-04-18 NOTE — Consult Note (Signed)
GI Inpatient Consult Note  Reason for Consult: Rectal Bleed    Attending Requesting Consult: Dr. Reece Levy  History of Present Illness: Sonia Simpson is a 81 y.o. female reports she started having pain in her lower abdomen yesterday.  She reports she had a large bowel movement  In the morning, but continued to have stomach pain.  She went to the [facility] and was unable to be seen until early afternoon, so she returned home.  She started to have more bowel movements but when she did it was just blood.  She called the [facility] but was unable to be seen and they recommended she go directly to the emergency room.  In emergency room she had a few more episodes of pure blood bowel movements.  Her last BM was at 5:00 a.m today and she reports that there was "a lot" of blood.  She reports that she had this  same issue year ago.  She was admitted at [facility], during her stay they did a flexible sigmoidoscopy.  The indication at that time was rectal hemorrhage.  The procedure included the endoscope was introduced through the anus and advanced to the left transverse colon the flexible sigmoidoscopy was accomplished without difficulty.  The findings were two sessile polyps were found in the sigmoid colon.  The polyps were 2-3 mm in size.  These were not removed in light of their diminutive size, her current age and the need to go back on aspirin and Plavix.  She also had multiple small and large mouth diverticula were found in the sigmoid colon.  She reports having a colonoscopy 10-15 years ago.  Past Medical History:  Past Medical History  Diagnosis Date  . Hypertension   . Diabetes mellitus without complication   . Renal disorder   . Coronary artery disease     Problem List: Patient Active Problem List   Diagnosis Date Noted  . Bright red rectal bleeding 04/18/2015  . Colitis 04/18/2015  . UTI (urinary tract infection) 04/18/2015  . HTN (hypertension) 04/18/2015  . DM (diabetes  mellitus) 04/18/2015  . CAD (coronary artery disease) 04/18/2015    Past Surgical History: Past Surgical History  Procedure Laterality Date  . Coronary angioplasty with stent placement      2009    Allergies: Allergies  Allergen Reactions  . Sulfa Antibiotics     Home Medications: No prescriptions prior to admission   Home medication reconciliation was completed with the patient.   Scheduled Inpatient Medications:   . [START ON 04/19/2015] ciprofloxacin  400 mg Intravenous Q24H  . insulin aspart  0-15 Units Subcutaneous 6 times per day  . metronidazole  500 mg Intravenous Once  . metronidazole  500 mg Intravenous Q8H  . pantoprazole (PROTONIX) IV  40 mg Intravenous Q12H    Continuous Inpatient Infusions:     PRN Inpatient Medications:  ondansetron **OR** ondansetron (ZOFRAN) IV  Family History: family history includes Diabetes Mellitus II in her mother; Diabetes type II in her brother and sister; Hypertension in her father.  The patient's family history is negative for inflammatory bowel disorders, GI malignancy, or solid organ transplantation.  Social History:   reports that she has never smoked. She does not have any smokeless tobacco history on file. She reports that she does not drink alcohol or use illicit drugs. The patient denies ETOH, tobacco, or drug use.   Review of Systems: Constitutional: Weight is stable.  Eyes: No changes in vision. ENT: No oral  lesions, sore throat.  GI: see HPI.  Heme/Lymph: No easy bruising.  CV: No chest pain.  GU: No hematuria.  Integumentary: No rashes.  Neuro: No headaches.  Psych: No depression/anxiety.  Endocrine: No heat/cold intolerance.  Allergic/Immunologic: No urticaria.  Resp: No cough, SOB.  Musculoskeletal: No joint swelling.    Physical Examination: BP 164/57 mmHg  Pulse 79  Temp(Src) 97.9 F (36.6 C) (Oral)  Resp 17  Ht 5\' 9"  (1.753 m)  Wt 83.19 kg (183 lb 6.4 oz)  BMI 27.07 kg/m2  SpO2 98% Gen: NAD,  alert and oriented x 4, good historian HEENT: PEERLA, EOMI, Neck: supple, no JVD or thyromegaly Chest: CTA bilaterally, no wheezes, crackles, or other adventitious sounds CV: RRR, no m/g/c/r Abd: soft, NT, ND, +BS in all four quadrants; no HSM, guarding, ridigity, or rebound tenderness Ext: no edema, well perfused with 2+ pulses, Skin: no rash or lesions noted Lymph: no LAD Rectal exam: negative without mass, lesions or tenderness, stool guaiac positive.  Data: Lab Results  Component Value Date   WBC 13.6* 04/18/2015   HGB 12.0 04/18/2015   HCT 37.6 04/18/2015   MCV 86.7 04/18/2015   PLT 141* 04/18/2015    Recent Labs Lab 04/17/15 2201 04/18/15 0452  HGB 12.9 12.0   Lab Results  Component Value Date   NA 135 04/18/2015   K 4.0 04/18/2015   CL 105 04/18/2015   CO2 21* 04/18/2015   BUN 27* 04/18/2015   CREATININE 1.39* 04/18/2015   Lab Results  Component Value Date   ALT 32 04/17/2015   AST 38 04/17/2015   ALKPHOS 112 04/17/2015   BILITOT 0.6 04/17/2015    Recent Labs Lab 04/17/15 2201  APTT 23*  INR 0.97   Imaging: CLINICAL DATA: 81 year old female with abdominal pain and GI bleeding. No indication of diarrhea.  EXAM: CT ABDOMEN AND PELVIS WITH CONTRAST  TECHNIQUE: Multidetector CT imaging of the abdomen and pelvis was performed using the standard protocol following bolus administration of intravenous contrast.  CONTRAST: 28mL OMNIPAQUE IOHEXOL 300 MG/ML SOLN  COMPARISON: None.  FINDINGS: Motion degraded study, but overall diagnostic.  BODY WALL: No contributory findings.  LOWER CHEST: No contributory findings.  ABDOMEN/PELVIS:  Liver: No focal abnormality.  Biliary: No evidence of biliary obstruction or stone.  Pancreas: Unremarkable.  Spleen: Unremarkable.  Adrenals: Unremarkable.  Kidneys and ureters: No hydronephrosis or stone.  Bladder: Unremarkable.  Reproductive: 45 mm calcified uterine fibroid. No  adnexal mass.  Bowel: There is circumferential low-density thickening of the colon centered at the level of the splenic flexure with surrounding fat infiltration. No associated obstruction or perforation. Extensive distal colonic diverticulosis. No acute vascular findings such as dissection or major vessel occlusion. No high-grade proximal mesenteric stenosis. Portal venous system is patent.  Retroperitoneum: No mass or adenopathy.  Peritoneum: No ascites or pneumoperitoneum.  Vascular: Atherosclerosis without acute arterial finding or proximal mesenteric stenosis.  OSSEOUS: No acute abnormalities. 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  IMPRESSION: 1. Colitis centered at the splenic flexure, pattern and history favoring ischemic over infectious colitis. 2. Atherosclerosis without acute vascular finding or proximal mesenteric stenosis.   Electronically Signed  By: Monte Fantasia M.D.  Assessment/Plan: Ms. Jaggard is a 81 y.o. female with GI bleed  Recommendations: She had a  flexible sigmoidoscopy at [facility] in September, 2015 for the same type of bleeding episode (please see HPI).  We recommend watchful waiting, and if she experiences more pain or bleeding day and repeat the flexible  sig at that time.  We agree with continue to hold the Plavix. We will continue to follow with you. Thank you for the consult. Please call with questions or concerns.  Salvadore Farber, PA-C  I personally performed these services.

## 2015-04-18 NOTE — Progress Notes (Signed)
Patient seen and examined and chart reviewed. At the present time she is much improved and there does not appear to be any indication for impending surgery. I would agree with the current management. He will not provide a formal consultation in this setting but remain available and follow from a distance. Should she develop further problems which might require intervention we will offer formal consultation.  Our services appreciates opportunity to be involved.

## 2015-04-18 NOTE — Progress Notes (Signed)
Inpatient Diabetes Program Recommendations  AACE/ADA: New Consensus Statement on Inpatient Glycemic Control (2013)  Target Ranges:  Prepandial:   less than 140 mg/dL      Peak postprandial:   less than 180 mg/dL (1-2 hours)      Critically ill patients:  140 - 180 mg/dL   Reason for assessment: elevated CBG   Diabetes history: Type 2 Outpatient Diabetes medications: None Current orders for Inpatient glycemic control: Novolog 0-15 units q6h  Please consider adding low dose Lantus 8 units qhs (0.1unit/kg)- CBG remain elevated despite Novolog correction.   Gentry Fitz, RN, BA, MHA, CDE Diabetes Coordinator Inpatient Diabetes Program  [phone] (Team Pager) [phone] ([place]) 04/18/2015 9:18 AM

## 2015-04-18 NOTE — Consult Note (Signed)
  Pt seen and examined. Please see C. Celesta Aver' notes. Pt with rectal bleeding. Episode similar to last year, when she went to [facility]. Had flex sigmoidoscopy to transverse colon then. 2 tiny polyps seen but not removed due to pt being on ASA/plavix. NO evidence of colitis then. CT now suggests ischemic colitis near splenic flexure. Pt already feeling better. NO more abdominal pain. Abdomen nontender. Minimal bleeding now. Ok to start clears. Since pt had flex sig last year, no need to repeat flex sig as long as pt is asymptomatic. Usually, ischemic colitis resolves on own. Ok to continue Abx for now. Advance diet as tolerated. Will follow. Thanks.

## 2015-04-18 NOTE — Progress Notes (Signed)
ANTIBIOTIC CONSULT NOTE - INITIAL  Pharmacy Consult for Ciprofloxacin Indication: UTI/colitis  Allergies  Allergen Reactions  . Sulfa Antibiotics     Patient Measurements: Height: 5\' 9"  (175.3 cm) Weight: 183 lb 6.4 oz (83.19 kg) IBW/kg (Calculated) : 66.2  Vital Signs: Temp: 97.9 F (36.6 C) (07/07 0240) Temp Source: Oral (07/07 0240) BP: 192/84 mmHg (07/07 0240) Pulse Rate: 84 (07/07 0240) Intake/Output from previous day:   Intake/Output from this shift:    Labs:  Recent Labs  04/17/15 2201  WBC 14.5*  HGB 12.9  PLT 175  CREATININE 1.55*   Estimated Creatinine Clearance: 26.7 mL/min (by C-G formula based on Cr of 1.55). No results for input(s): VANCOTROUGH, VANCOPEAK, VANCORANDOM, GENTTROUGH, GENTPEAK, GENTRANDOM, TOBRATROUGH, TOBRAPEAK, TOBRARND, AMIKACINPEAK, AMIKACINTROU, AMIKACIN in the last 72 hours.   Microbiology: No results found for this or any previous visit (from the past 720 hour(s)).   Urinalysis    Component Value Date/Time   COLORURINE STRAW* 04/17/2015 2241   APPEARANCEUR CLEAR* 04/17/2015 2241   LABSPEC 1.006 04/17/2015 2241   PHURINE 5.0 04/17/2015 2241   GLUCOSEU 150* 04/17/2015 2241   HGBUR 3+* 04/17/2015 2241   [place] 04/17/2015 2241   [place] 04/17/2015 2241   PROTEINUR 100* 04/17/2015 2241   NITRITE NEGATIVE 04/17/2015 2241   LEUKOCYTESUR NEGATIVE 04/17/2015 2241       Medical History: Past Medical History  Diagnosis Date  . Hypertension   . Diabetes mellitus without complication   . Renal disorder   . Coronary artery disease     Medications:  No prescriptions prior to admission   Scheduled:  . [START ON 04/19/2015] ciprofloxacin  400 mg Intravenous Q24H  . insulin aspart  0-15 Units Subcutaneous 6 times per day  . metronidazole  500 mg Intravenous Once  . metronidazole  500 mg Intravenous Q8H  . pantoprazole (PROTONIX) IV  40 mg Intravenous Q12H   Infusions:   PRN: ondansetron **OR**  ondansetron (ZOFRAN) IV  Assessment: Patient admitted with BRBPR, colitis, and UTI ordered ciprofloxacin and metronidazole.   Goal of Therapy:  Resolution of infection  Plan:  Ciprofloxacin 400 mg iv q 24 h. Will f/u culture results.   Ulice Dash D 04/18/2015,2:58 AM

## 2015-04-18 NOTE — Consult Note (Signed)
CC:  Abdominal pain, bright red blood per rectum  HPI:  Sonia Simpson is a pleasant 81 yo F with a history of CAD s/p stent in 2009 on plavix who presents with 1 day of abdominal pain and 1/2 day of BRBPR.  Began acutely last night.  Diffuse but centered in epigastrium and both upper quadrants.  Attempted to go to urgent care but left as was not able to be seen promptly.  Began having 8 episodes of brbpr beginning approx 10 hours ago, last bm 2 hours ago.  Pain minimal now.  No sick contacts but went to a family reunion this weekend and feels that she ate some food which may have been left out a bit long.  No fevers/chills, night sweats, shortness of breath, cough, chest pain, nausea/vomiting, dysuria/hematuria.  Last Colonoscopy was 1 year ago at [facility] which she reports was normal.    Active Ambulatory Problems    Diagnosis Date Noted  . No Active Ambulatory Problems   Resolved Ambulatory Problems    Diagnosis Date Noted  . No Resolved Ambulatory Problems   Past Medical History  Diagnosis Date  . Hypertension   . Diabetes mellitus without complication   . Renal disorder   . Coronary artery disease    Allergies  Allergen Reactions  . Sulfa Antibiotics    History   Social History  . Marital Status: Widowed    Spouse Name: N/A  . Number of Children: N/A  . Years of Education: N/A   Occupational History  . Not on file.   Social History Main Topics  . Smoking status: Never Smoker   . Smokeless tobacco: Not on file  . Alcohol Use: No  . Drug Use: No  . Sexual Activity: Not on file   Other Topics Concern  . Not on file   Social History Narrative   Family History  Problem Relation Age of Onset  . Diabetes Mellitus II Mother   . Hypertension Father   . Diabetes type II Sister   . Diabetes type II Brother    ROS: Full ROS obtained, pertinent positives and negatives as above  Blood pressure 194/78, pulse 92, temperature 98.3 F (36.8 C), temperature  source Oral, resp. rate 20, height 5\' 8"  (1.727 m), weight 83.008 kg (183 lb), SpO2 98 %. GEN: NAD/A&Ox3 FACE: no obvious facial trauma, normal external nose, normal external ears EYES: no scleral icterus, no conjunctivitis HEAD: normocephalic atraumatic CV: RRR, no MRG RESP: moving air well, lungs clear ABD: soft, nontender, nondistended EXT: moving all ext well, strength 5/5 NEURO: cnII-XII grossly intact, sensation intact all 4 ext  Labs: Personally reviewed, pertinent for  WBC 14.5, 93% N Hgb 12.9 Cr 1.55, BUN 31  CT: Personally reviewed, pertinent for - thickening of colon, splenic flexure  A/P 81 yo F who presents with acute onset abdominal pain, brbpr.  Pain and bleeding have subsided.  Suspect ischemic colitis.  Recommend resuscitation, d/c plavix and platelets if continues to bleed.  Would recommend GI consult and possibly tagged RBC scan to isolate bleeding.  No acute surgical issues currently.

## 2015-04-18 NOTE — ED Notes (Signed)
Pt resting quietly.  nsr on monitor. Iv in place.  Skin warm and dry.  Alert.

## 2015-04-19 LAB — GLUCOSE, CAPILLARY
GLUCOSE-CAPILLARY: 70 mg/dL (ref 65–99)
GLUCOSE-CAPILLARY: 129 mg/dL — AB (ref 65–99)
Glucose-Capillary: 130 mg/dL — ABNORMAL HIGH (ref 65–99)
Glucose-Capillary: 245 mg/dL — ABNORMAL HIGH (ref 65–99)
Glucose-Capillary: 196 mg/dL — ABNORMAL HIGH (ref 65–99)
Glucose-Capillary: 272 mg/dL — ABNORMAL HIGH (ref 65–99)

## 2015-04-19 LAB — CBC
HCT: 32.5 % — ABNORMAL LOW (ref 35.0–47.0)
Hemoglobin: 10.3 g/dL — ABNORMAL LOW (ref 12.0–16.0)
MCH: 27.5 pg (ref 26.0–34.0)
MCHC: 31.6 g/dL — ABNORMAL LOW (ref 32.0–36.0)
MCV: 87.1 fL (ref 80.0–100.0)
PLATELETS: 116 10*3/uL — AB (ref 150–440)
RBC: 3.74 MIL/uL — AB (ref 3.80–5.20)
RDW: 13.6 % (ref 11.5–14.5)
WBC: 12.7 10*3/uL — ABNORMAL HIGH (ref 3.6–11.0)

## 2015-04-19 LAB — HEMOGLOBIN AND HEMATOCRIT, BLOOD
HCT: 34 % — ABNORMAL LOW (ref 35.0–47.0)
HEMOGLOBIN: 10.9 g/dL — AB (ref 12.0–16.0)

## 2015-04-19 MED ORDER — SACCHAROMYCES BOULARDII 250 MG PO CAPS
250.0000 mg | ORAL_CAPSULE | Freq: Two times a day (BID) | ORAL | Status: DC
  Administered 2015-04-19 – 2015-04-20 (×3): 250 mg via ORAL
  Filled 2015-04-19 (×4): qty 1

## 2015-04-19 MED ORDER — CIPROFLOXACIN HCL 500 MG PO TABS
500.0000 mg | ORAL_TABLET | Freq: Every day | ORAL | Status: DC
  Administered 2015-04-20: 500 mg via ORAL
  Filled 2015-04-19: qty 1

## 2015-04-19 MED ORDER — METRONIDAZOLE 500 MG PO TABS
500.0000 mg | ORAL_TABLET | Freq: Three times a day (TID) | ORAL | Status: DC
  Administered 2015-04-19 – 2015-04-20 (×3): 500 mg via ORAL
  Filled 2015-04-19 (×3): qty 1

## 2015-04-19 NOTE — Progress Notes (Signed)
[facility] [place] NAME: Normalee Fickes    MR#:  [id number]  DATE OF BIRTH:  11-22-1920  SUBJECTIVE:  CHIEF COMPLAINT:  Resting comfortably. Denies any abdominal pain. No other episodes of GI bleed .Remote history of hemorrhoids in the past  REVIEW OF SYSTEMS:  CONSTITUTIONAL: No fever, fatigue .reporting wekness.  EYES: No blurred or double vision.  EARS, NOSE, AND THROAT: No tinnitus or ear pain.  RESPIRATORY: No cough, shortness of breath, wheezing or hemoptysis.  CARDIOVASCULAR: No chest pain, orthopnea, edema.  GASTROINTESTINAL: No nausea, vomiting, diarrhea or abdominal pain.  GENITOURINARY: No dysuria, hematuria.  ENDOCRINE: No polyuria, nocturia,  HEMATOLOGY: No anemia, easy bruising or bleeding SKIN: No rash or lesion. MUSCULOSKELETAL: No joint pain or arthritis.   NEUROLOGIC: No tingling, numbness, weakness.  PSYCHIATRY: No anxiety or depression.   DRUG ALLERGIES:   Allergies  Allergen Reactions  . Sulfa Antibiotics     VITALS:  Blood pressure 135/62, pulse 61, temperature 98.5 F (36.9 C), temperature source Oral, resp. rate 16, height 5\' 9"  (1.753 m), weight 85.866 kg (189 lb 4.8 oz), SpO2 99 %.  PHYSICAL EXAMINATION:  GENERAL:  81 y.o.-year-old patient lying in the bed with no acute distress.  EYES: Pupils equal, round, reactive to light and accommodation. No scleral icterus. Extraocular muscles intact.  HEENT: Head atraumatic, normocephalic. Oropharynx and nasopharynx clear.  NECK:  Supple, no jugular venous distention. No thyroid enlargement, no tenderness.  LUNGS: Normal breath sounds bilaterally, no wheezing, rales,rhonchi or crepitation. No use of accessory muscles of respiration.  CARDIOVASCULAR: S1, S2 normal. No murmurs, rubs, or gallops.  ABDOMEN: Soft, nontender, nondistended. Bowel sounds present. No organomegaly or mass.  EXTREMITIES: No pedal edema, cyanosis, or clubbing.  NEUROLOGIC: Cranial  nerves II through XII are intact. Muscle strength 5/5 in all extremities. Sensation intact. Gait not checked.  PSYCHIATRIC: The patient is alert and oriented x 3.  SKIN: No obvious rash, lesion, or ulcer.    LABORATORY PANEL:   CBC  Recent Labs Lab 04/19/15 0311  WBC 12.7*  HGB 10.3*  HCT 32.5*  PLT 116*   ------------------------------------------------------------------------------------------------------------------  Chemistries   Recent Labs Lab 04/17/15 2201 04/18/15 0452  NA 133* 135  K 4.2 4.0  CL 101 105  CO2 22 21*  GLUCOSE 277* 277*  BUN 31* 27*  CREATININE 1.55* 1.39*  CALCIUM 8.8* 8.5*  AST 38  --   ALT 32  --   ALKPHOS 112  --   BILITOT 0.6  --    ------------------------------------------------------------------------------------------------------------------  Cardiac Enzymes  Recent Labs Lab 04/17/15 2201  TROPONINI <0.03   ------------------------------------------------------------------------------------------------------------------  RADIOLOGY:  Ct Abdomen Pelvis W Contrast  04/18/2015   CLINICAL DATA:  81 year old female with abdominal pain and GI bleeding. No indication of diarrhea.  EXAM: CT ABDOMEN AND PELVIS WITH CONTRAST  TECHNIQUE: Multidetector CT imaging of the abdomen and pelvis was performed using the standard protocol following bolus administration of intravenous contrast.  CONTRAST:  63mL OMNIPAQUE IOHEXOL 300 MG/ML  SOLN  COMPARISON:  None.  FINDINGS: Motion degraded study, but overall diagnostic.  BODY WALL: No contributory findings.  LOWER CHEST: No contributory findings.  ABDOMEN/PELVIS:  Liver: No focal abnormality.  Biliary: No evidence of biliary obstruction or stone.  Pancreas: Unremarkable.  Spleen: Unremarkable.  Adrenals: Unremarkable.  Kidneys and ureters: No hydronephrosis or stone.  Bladder: Unremarkable.  Reproductive: 45 mm calcified uterine fibroid.  No adnexal mass.  Bowel: There is circumferential low-density  thickening of the colon centered at the level of the splenic flexure with surrounding fat infiltration. No associated obstruction or perforation. Extensive distal colonic diverticulosis. No acute vascular findings such as dissection or major vessel occlusion. No high-grade proximal mesenteric stenosis. Portal venous system is patent.  Retroperitoneum: No mass or adenopathy.  Peritoneum: No ascites or pneumoperitoneum.  Vascular: Atherosclerosis without acute arterial finding or proximal mesenteric stenosis.  OSSEOUS: No acute abnormalities. 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  IMPRESSION: 1. Colitis centered at the splenic flexure, pattern and history favoring ischemic over infectious colitis. 2. Atherosclerosis without acute vascular finding or proximal mesenteric stenosis.   Electronically Signed   By: Monte Fantasia M.D.   On: 04/18/2015 00:03    EKG:   Orders placed or performed during the hospital encounter of 04/17/15  . EKG 12-Lead  . EKG 12-Lead    ASSESSMENT AND PLAN:  #. Bright red blood per rectum probably secondary to Colitis-CT abdomen favors ischemic over infectious. Admitting physician has discussed with the radiologist who opined nonobstructive ischemic etiology. Status post 2 units of PRBC.  monitor serial H&H, IV Protonix,  GI consultation requested, discussed with Dr. Jenetta Downer h, recommended to continue antibiotics and advance diet as tolerated. No surgical recommendations by surgical consultation , recommends to continue Antibiotics We'll hold aspirin and Plavix for now because of acute GI bleed. Monitor.   #. UTI- on empiric IV antibiotics follow up culture results, #. Hypertension, systolic blood pressure high.  hydralazine is added , titrate as needed basis  #. Diabetes mellitus as type II on insulin. Patient takes 22 units of Lantus subcutaneous nightly daily at bedtime. Currently patient being on clear liquid diet will provide 8 units of Lantus  #.  History of coronary artery disease status post stent, stable, no acute problems.  #CK D stage II, stable. Getting better #Weakness/with history of hip arthritis-PT consult for deconditioning  Disposition based on PT recommendations  Plan of care discussed and the patient and her daughter Ms.Leeroy Bock over the phone- agreeable with the current plan of care      All the records are reviewed and case discussed with Care Management/Social Workerr. Management plans discussed with the patient, family and they are in agreement.  CODE STATUS: Full code  TOTAL TIME TAKING CARE OF THIS PATIENT: 35 minutes.   POSSIBLE D/C IN am  , DEPENDING ON CLINICAL CONDITION.   Nicholes Mango M.D on 04/19/2015 at 1:03 PM  Between 7am to 6pm - Pager - [phone] After 6pm go to [email or web address] - password [email or web address] [facility]  [facility]  Office  [phone]  CC: Primary care physician; No primary care provider on file.

## 2015-04-19 NOTE — Evaluation (Signed)
Physical Therapy Evaluation Patient Details Name: Sonia Simpson MRN: [id number] DOB: Nov 26, 1920 Today's Date: 04/19/2015   History of Present Illness  Pt was admitted to the hospital with bouts of bloody diarrhea and colitis   Clinical Impression  Pt presents with hx of HTN, DM, renal disorder, and CAD. Examination reveals that pt is able to perform bed mobility and transfers at full independence. She ambulates 400 ft in 2 bouts at supervision (for safety within the hospital) with a single point cane. No observed LOBs or significant gait deviations. Pt performed 8 steps up/down at [facility] for safety within the hospital. She stated no fatigue and demonstrated proper and safe navigation of the stairs with one hand rail and her cane. She has 3 stairs at home. Pt appears to have no physical deficits and is at her baseline level of physical function. Pt agreed with this, and also agreed that she needs no further PT. Pt will therefore be d/c'd in house and will no longer receive the services of PT. PT orders will be completed.     Follow Up Recommendations No PT follow up    Equipment Recommendations  None recommended by PT    Recommendations for Other Services       Precautions / Restrictions Restrictions Weight Bearing Restrictions: No      Mobility  Bed Mobility Overal bed mobility: Independent             General bed mobility comments: Needs no assistance  Transfers Overall transfer level: Independent (Does not need cane for succesful transfer) Equipment used: None             General transfer comment: Needs no assistance  Ambulation/Gait Ambulation/Gait assistance: Modified independent (Device/Increase time);Supervision Ambulation Distance (Feet): 400 Feet Assistive device: Straight cane Gait Pattern/deviations: WFL(Within Functional Limits)     General Gait Details: Pt able to perform 400 ft of ambulation in 2 sets. She stated no fatigue. At the end of the 2nd set  she navigated up/down 8 steps  Stairs Stairs: Yes (8 steps up/down) Stairs assistance: Min guard (for safety within the hospital) Stair Management: One rail Left Number of Stairs: 8 General stair comments: able to safely navigate up/down with no safety concern by PT  Wheelchair Mobility    Modified Rankin (Stroke Patients Only)       Balance Overall balance assessment: No apparent balance deficits (not formally assessed)                                           Pertinent Vitals/Pain Pain Assessment: No/denies pain    Home Living Family/patient expects to be discharged to:: Private residence Living Arrangements: Alone (Son lives down the street) Available Help at Discharge: Family;Friend(s);Neighbor Type of Home: House Home Access: Stairs to enter Entrance Stairs-Rails: Can reach both Entrance Stairs-Number of Steps: 3 Home Layout: Able to live on main level with bedroom/bathroom;One level Home Equipment: Cane - single point Additional Comments: Pt states no need for additional DME    Prior Function Level of Independence: Independent with assistive device(s)         Comments: Pt able to perform all household ambulation at mod I with cane. She states she typically fatigues after 250 - 300 ft.      Hand Dominance        Extremity/Trunk Assessment   Upper Extremity Assessment: Overall WFL for tasks  assessed           Lower Extremity Assessment: Overall WFL for tasks assessed         Communication   Communication: No difficulties  Cognition Arousal/Alertness: Awake/alert Behavior During Therapy: WFL for tasks assessed/performed Overall Cognitive Status: Within Functional Limits for tasks assessed                      General Comments      Exercises        Assessment/Plan    PT Assessment Patent does not need any further PT services  PT Diagnosis Other (comment) (No apparent physical deficits )   PT Problem List     PT Treatment Interventions     PT Goals (Current goals can be found in the Care Plan section) Acute Rehab PT Goals Patient Stated Goal: to return home PT Goal Formulation: With patient Time For Goal Achievement: 05/03/15 Potential to Achieve Goals: Good    Frequency     Barriers to discharge        Co-evaluation               End of Session Equipment Utilized During Treatment: Gait belt Activity Tolerance: Patient tolerated treatment well Patient left: in chair;with call bell/phone within reach;with family/visitor present Nurse Communication: Mobility status         Time: [id number] PT Time Calculation (min) (ACUTE ONLY): 31 min   Charges:         PT G CodesJanyth Contes 2015/04/26, 4:11 PM  Janyth Contes, SPT. [id number]

## 2015-04-19 NOTE — Consult Note (Signed)
  GI Inpatient Follow-up Note  Patient Identification: Sonia Simpson is a 81 y.o. female  Subjective: No abdominal pain. No rectal bleeding. Started solids for lunch. So far, ok.  Scheduled Inpatient Medications:  . [START ON 04/20/2015] ciprofloxacin  500 mg Oral Daily  . hydrALAZINE  25 mg Oral 3 times per day  . insulin aspart  0-15 Units Subcutaneous 6 times per day  . insulin glargine  8 Units Subcutaneous Daily  . metronidazole  500 mg Intravenous Once  . metroNIDAZOLE  500 mg Oral 3 times per day  . pantoprazole (PROTONIX) IV  40 mg Intravenous Q12H  . saccharomyces boulardii  250 mg Oral BID    Continuous Inpatient Infusions:     PRN Inpatient Medications:  ondansetron **OR** ondansetron (ZOFRAN) IV  Review of Systems: Constitutional: Weight is stable.  Eyes: No changes in vision. ENT: No oral lesions, sore throat.  GI: see HPI.  Heme/Lymph: No easy bruising.  CV: No chest pain.  GU: No hematuria.  Integumentary: No rashes.  Neuro: No headaches.  Psych: No depression/anxiety.  Endocrine: No heat/cold intolerance.  Allergic/Immunologic: No urticaria.  Resp: No cough, SOB.  Musculoskeletal: No joint swelling.    Physical Examination: BP 128/48 mmHg  Pulse 59  Temp(Src) 98.5 F (36.9 C) (Oral)  Resp 18  Ht 5\' 9"  (1.753 m)  Wt 85.866 kg (189 lb 4.8 oz)  BMI 27.94 kg/m2  SpO2 100% Gen: NAD, alert and oriented x 4 HEENT: PEERLA, EOMI, Neck: supple, no JVD or thyromegaly Chest: CTA bilaterally, no wheezes, crackles, or other adventitious sounds CV: RRR, no m/g/c/r Abd: soft, NT, ND, +BS in all four quadrants; no HSM, guarding, ridigity, or rebound tenderness Ext: no edema, well perfused with 2+ pulses, Skin: no rash or lesions noted Lymph: no LAD  Data: Lab Results  Component Value Date   WBC 12.7* 04/19/2015   HGB 10.9* 04/19/2015   HCT 34.0* 04/19/2015   MCV 87.1 04/19/2015   PLT 116* 04/19/2015    Recent Labs Lab 04/18/15 2015  04/19/15 0311 04/19/15 1338  HGB 10.9* 10.3* 10.9*   Lab Results  Component Value Date   NA 135 04/18/2015   K 4.0 04/18/2015   CL 105 04/18/2015   CO2 21* 04/18/2015   BUN 27* 04/18/2015   CREATININE 1.39* 04/18/2015   Lab Results  Component Value Date   ALT 32 04/17/2015   AST 38 04/17/2015   ALKPHOS 112 04/17/2015   BILITOT 0.6 04/17/2015    Recent Labs Lab 04/17/15 2201  APTT 23*  INR 0.97   Assessment/Plan: Ms. Clevinger is a 81 y.o. female with probable ischemic colitis. Clinically stable.  Recommendations: If tolerates solids rest of today, ok for discharge by tomorrow. Will sign off. Call GI on call if condition changes. thanks Please call with questions or concerns.  Falon Flinchum, Lupita Dawn, MD

## 2015-04-20 LAB — CBC
HCT: 31.8 % — ABNORMAL LOW (ref 35.0–47.0)
HEMOGLOBIN: 10.2 g/dL — AB (ref 12.0–16.0)
MCH: 27.9 pg (ref 26.0–34.0)
MCHC: 32.1 g/dL (ref 32.0–36.0)
MCV: 86.8 fL (ref 80.0–100.0)
Platelets: 106 10*3/uL — ABNORMAL LOW (ref 150–440)
RBC: 3.66 MIL/uL — ABNORMAL LOW (ref 3.80–5.20)
RDW: 13.5 % (ref 11.5–14.5)
WBC: 8 10*3/uL (ref 3.6–11.0)

## 2015-04-20 LAB — URINE CULTURE: Culture: 80000

## 2015-04-20 LAB — GLUCOSE, CAPILLARY
Glucose-Capillary: 99 mg/dL (ref 65–99)
Glucose-Capillary: 197 mg/dL — ABNORMAL HIGH (ref 65–99)

## 2015-04-20 LAB — HEMOGLOBIN AND HEMATOCRIT, BLOOD
HEMATOCRIT: 34.6 % — AB (ref 35.0–47.0)
Hemoglobin: 11.3 g/dL — ABNORMAL LOW (ref 12.0–16.0)

## 2015-04-20 MED ORDER — PRAZIQUANTEL POWD: 850.0000 mg | Freq: Once | Status: DC

## 2015-04-20 MED ORDER — INSULIN GLARGINE 100 UNIT/ML ~~LOC~~ SOLN: 8.0000 [IU] | Freq: Every day | SUBCUTANEOUS | Status: DC

## 2015-04-20 MED ORDER — CIPROFLOXACIN HCL 500 MG PO TABS
500.0000 mg | ORAL_TABLET | Freq: Two times a day (BID) | ORAL | Status: DC
Start: 2015-04-20 — End: 2017-06-06

## 2015-04-20 MED ORDER — METRONIDAZOLE 500 MG PO TABS
500.0000 mg | ORAL_TABLET | Freq: Three times a day (TID) | ORAL | Status: DC
Start: 2015-04-20 — End: 2017-06-06

## 2015-04-20 MED ORDER — CIPROFLOXACIN HCL 500 MG PO TABS: 500.0000 mg | ORAL_TABLET | Freq: Every day | ORAL | Status: DC

## 2015-04-20 MED ORDER — CEPHALEXIN 500 MG PO CAPS: 500.0000 mg | ORAL_CAPSULE | Freq: Two times a day (BID) | ORAL | Status: DC

## 2015-04-20 MED ORDER — HYDRALAZINE HCL 25 MG PO TABS: 25.0000 mg | ORAL_TABLET | Freq: Three times a day (TID) | ORAL | Status: DC

## 2015-04-20 MED ORDER — SACCHAROMYCES BOULARDII 250 MG PO CAPS: 250.0000 mg | ORAL_CAPSULE | Freq: Two times a day (BID) | ORAL | Status: AC

## 2015-04-20 NOTE — Discharge Summary (Addendum)
[facility] NAME: Sonia Simpson    MR#:  [id number]  DATE OF BIRTH:  March 30, 1921  DATE OF ADMISSION:  04/17/2015 ADMITTING PHYSICIAN: Juluis Mire, MD  DATE OF DISCHARGE: 04/20/2015 11:25 AM  PRIMARY CARE PHYSICIAN: 04/20/2015  ADMISSION DIAGNOSIS:  Colitis, ischemic [K55.9] Lower GI bleed [K92.2] UTI (lower urinary tract infection) [N39.0] Diverticulosis of large intestine with hemorrhage [K57.31]  DISCHARGE DIAGNOSIS:  Principal Problem:   Ischemic colitis Active Problems:   Colitis   UTI (urinary tract infection)   HTN (hypertension)   DM (diabetes mellitus)   CAD (coronary artery disease)   SECONDARY DIAGNOSIS:   Past Medical History  Diagnosis Date  . Hypertension   . Diabetes mellitus without complication   . Renal disorder   . Coronary artery disease     HOSPITAL COURSE:  #. Bright red blood per rectum probably secondary to Colitis-CT abdomen favors ischemic over infectious. GI consultation requested, discussed with Dr. Jenetta Downer h, recommended to continue antibiotics and advance diet as tolerated. No surgical recommendations by surgical consultation , recommends to continue Antibiotics ciprofloxacin and Flagyl We'll hold aspirin and Plavix for now because of acute GI bleed. Monitor. PCP to consider resuming during follow-up visit Son is concerned for tape warm infestation- stool for Ova and parasites is ordered, will treat empirically with praziquantel one-time dose    #. UTI- with Escherichia coli sensitive to cefazolin, will treat with by mouth Keflex 500 mg by mouth every 12 hours for 7 days #. Hypertension, systolic blood pressure high. hydralazine is added , titrate as needed basis  #. Diabetes mellitus as type II on insulin. Patient takes 22 units of Lantus subcutaneous nightly daily at bedtime. Currently will provide 8 units of Lantus , PCP can consider resuming Lantus if patient is stable #. History  of coronary artery disease status post stent, stable, no acute problems. #CK D stage II, stable.  #Weakness/with history of hip arthritis-PT consult for deconditioning- no new recommendations    DISCHARGE CONDITIONS:   Satisfactory  CONSULTS OBTAINED:  Treatment Team:  Marlyce Huge, MD Nicholes Mango, MD Hulen Luster, MD Dia Crawford III, MD   PROCEDURES none  DRUG ALLERGIES:   Allergies  Allergen Reactions  . Sulfa Antibiotics     DISCHARGE MEDICATIONS:   Discharge Medication List as of 04/20/2015 11:07 AM    START taking these medications   Details  insulin glargine (LANTUS) 100 UNIT/ML injection Inject 0.08 mLs (8 Units total) into the skin daily., Starting 04/20/2015, Until Discontinued, Print    metroNIDAZOLE (FLAGYL) 500 MG tablet Take 1 tablet (500 mg total) by mouth 3 (three) times daily., Starting 04/20/2015, Until Discontinued, Print    Praziquantel POWD Take 850 mg by mouth once., Starting 04/20/2015, Print    saccharomyces boulardii (FLORASTOR) 250 MG capsule Take 1 capsule (250 mg total) by mouth 2 (two) times daily., Starting 04/20/2015, Until Discontinued, Print      CONTINUE these medications which have CHANGED   Details  ciprofloxacin (CIPRO) 500 MG tablet Take 1 tablet (500 mg total) by mouth 2 (two) times daily., Starting 04/20/2015, Until Discontinued, Print    hydrALAZINE (APRESOLINE) 25 MG tablet Take 1 tablet (25 mg total) by mouth every 8 (eight) hours., Starting 04/20/2015, Until Discontinued, Print      CONTINUE these medications which have NOT CHANGED   Details  aspirin 81 MG tablet Take 81 mg by mouth daily., Until Discontinued, Historical Med  CARVEDILOL PO Take by mouth 2 (two) times daily., Until Discontinued, Historical Med    clopidogrel (PLAVIX) 75 MG tablet Take 75 mg by mouth daily., Until Discontinued, Historical Med    LISINOPRIL PO Take by mouth daily., Until Discontinued, Historical Med         DISCHARGE INSTRUCTIONS:    Follow-up with primary care physician in a week Follow-up with gastroenterology when necessary  DIET:  Healthy heart, ADA  DISCHARGE CONDITION:  Fair  ACTIVITY:  Activity as tolerated  OXYGEN:  Home Oxygen: No.   Oxygen Delivery: room air  DISCHARGE LOCATION:  home   If you experience worsening of your admission symptoms, develop shortness of breath, life threatening emergency, suicidal or homicidal thoughts you must seek medical attention immediately by calling 911 or calling your MD immediately  if symptoms less severe.  You Must read complete instructions/literature along with all the possible adverse reactions/side effects for all the Medicines you take and that have been prescribed to you. Take any new Medicines after you have completely understood and accpet all the possible adverse reactions/side effects.   Please note  You were cared for by a hospitalist during your hospital stay. If you have any questions about your discharge medications or the care you received while you were in the hospital after you are discharged, you can call the unit and asked to speak with the hospitalist on call if the hospitalist that took care of you is not available. Once you are discharged, your primary care physician will handle any further medical issues. Please note that NO REFILLS for any discharge medications will be authorized once you are discharged, as it is imperative that you return to your primary care physician (or establish a relationship with a primary care physician if you do not have one) for your aftercare needs so that they can reassess your need for medications and monitor your lab values.     Today  Chief Complaint  Patient presents with  . Rectal Bleeding  . Abdominal Pain     ROS: none CONSTITUTIONAL: Denies fevers, chills. Denies any fatigue, weakness.  EYES: Denies blurry vision, double vision, eye pain. EARS, NOSE, THROAT: Denies tinnitus, ear pain, hearing  loss. RESPIRATORY: Denies cough, wheeze, shortness of breath.  CARDIOVASCULAR: Denies chest pain, palpitations, edema.  GASTROINTESTINAL: Denies nausea, vomiting, diarrhea, abdominal pain. Denies bright red blood per rectum. GENITOURINARY: Denies dysuria, hematuria. ENDOCRINE: Denies nocturia or thyroid problems. HEMATOLOGIC AND LYMPHATIC: Denies easy bruising or bleeding. SKIN: Denies rash or lesion. MUSCULOSKELETAL: Denies pain in neck, back, shoulder, knees, hips or arthritic symptoms.  NEUROLOGIC: Denies paralysis, paresthesias.  PSYCHIATRIC: Denies anxiety or depressive symptoms.   VITAL SIGNS:  Blood pressure 146/51, pulse 65, temperature 97.8 F (36.6 C), temperature source Oral, resp. rate 18, height 5\' 9"  (1.753 m), weight 85.911 kg (189 lb 6.4 oz), SpO2 99 %.  I/O:    Intake/Output Summary (Last 24 hours) at 04/20/15 1426 Last data filed at 04/20/15 0900  Gross per 24 hour  Intake    240 ml  Output   1650 ml  Net  -1410 ml    PHYSICAL EXAMINATION:  GENERAL:  81 y.o.-year-old patient lying in the bed with no acute distress.  EYES: Pupils equal, round, reactive to light and accommodation. No scleral icterus. Extraocular muscles intact.  HEENT: Head atraumatic, normocephalic. Oropharynx and nasopharynx clear.  NECK:  Supple, no jugular venous distention. No thyroid enlargement, no tenderness.  LUNGS: Normal breath sounds bilaterally, no wheezing, rales,rhonchi  or crepitation. No use of accessory muscles of respiration.  CARDIOVASCULAR: S1, S2 normal. No murmurs, rubs, or gallops.  ABDOMEN: Soft, non-tender, non-distended. Bowel sounds present. No organomegaly or mass.  EXTREMITIES: No pedal edema, cyanosis, or clubbing.  NEUROLOGIC: Cranial nerves II through XII are intact. Muscle strength 5/5 in all extremities. Sensation intact. Gait not checked.  PSYCHIATRIC: The patient is alert and oriented x 3.  SKIN: No obvious rash, lesion, or ulcer.   DATA REVIEW:    CBC  Recent Labs Lab 04/20/15 0650  WBC 8.0  HGB 10.2*  HCT 31.8*  PLT 106*    Chemistries   Recent Labs Lab 04/17/15 2201 04/18/15 0452  NA 133* 135  K 4.2 4.0  CL 101 105  CO2 22 21*  GLUCOSE 277* 277*  BUN 31* 27*  CREATININE 1.55* 1.39*  CALCIUM 8.8* 8.5*  AST 38  --   ALT 32  --   ALKPHOS 112  --   BILITOT 0.6  --     Cardiac Enzymes  Recent Labs Lab 04/17/15 2201  TROPONINI <0.03    Microbiology Results  Results for orders placed or performed during the hospital encounter of 04/17/15  Urine culture     Status: None   Collection Time: 04/17/15 10:01 PM  Result Value Ref Range Status   Specimen Description URINE, RANDOM  Final   Special Requests NONE  Final   Culture   Final    80,000 COLONIES/ml ESCHERICHIA COLI WITH MIXED BACTERIAL ORGANISMS    Report Status 04/20/2015 FINAL  Final   Organism ID, Bacteria ESCHERICHIA COLI  Final      Susceptibility   Escherichia coli - MIC*    AMPICILLIN 8 SENSITIVE Sensitive     CEFTAZIDIME <=1 SENSITIVE Sensitive     CEFAZOLIN <=4 SENSITIVE Sensitive     CEFTRIAXONE <=1 SENSITIVE Sensitive     CIPROFLOXACIN >=4 RESISTANT Resistant     GENTAMICIN <=1 SENSITIVE Sensitive     IMIPENEM <=0.25 SENSITIVE Sensitive     TRIMETH/SULFA <=20 SENSITIVE Sensitive     * 80,000 COLONIES/ml ESCHERICHIA COLI    RADIOLOGY:  Ct Abdomen Pelvis W Contrast  04/18/2015   CLINICAL DATA:  81 year old female with abdominal pain and GI bleeding. No indication of diarrhea.  EXAM: CT ABDOMEN AND PELVIS WITH CONTRAST  TECHNIQUE: Multidetector CT imaging of the abdomen and pelvis was performed using the standard protocol following bolus administration of intravenous contrast.  CONTRAST:  69mL OMNIPAQUE IOHEXOL 300 MG/ML  SOLN  COMPARISON:  None.  FINDINGS: Motion degraded study, but overall diagnostic.  BODY WALL: No contributory findings.  LOWER CHEST: No contributory findings.  ABDOMEN/PELVIS:  Liver: No focal abnormality.  Biliary:  No evidence of biliary obstruction or stone.  Pancreas: Unremarkable.  Spleen: Unremarkable.  Adrenals: Unremarkable.  Kidneys and ureters: No hydronephrosis or stone.  Bladder: Unremarkable.  Reproductive: 45 mm calcified uterine fibroid.  No adnexal mass.  Bowel: There is circumferential low-density thickening of the colon centered at the level of the splenic flexure with surrounding fat infiltration. No associated obstruction or perforation. Extensive distal colonic diverticulosis. No acute vascular findings such as dissection or major vessel occlusion. No high-grade proximal mesenteric stenosis. Portal venous system is patent.  Retroperitoneum: No mass or adenopathy.  Peritoneum: No ascites or pneumoperitoneum.  Vascular: Atherosclerosis without acute arterial finding or proximal mesenteric stenosis.  OSSEOUS: No acute abnormalities. 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  IMPRESSION: 1. Colitis centered at the splenic flexure, pattern and history  favoring ischemic over infectious colitis. 2. Atherosclerosis without acute vascular finding or proximal mesenteric stenosis.   Electronically Signed   By: Monte Fantasia M.D.   On: 04/18/2015 00:03    EKG:   Orders placed or performed during the hospital encounter of 04/17/15  . EKG 12-Lead  . EKG 12-Lead      Management plans discussed with the patient, family and they are in agreement.  CODE STATUS:     Code Status Orders        Start     Ordered   04/18/15 0250  Full code   Continuous     04/18/15 0249    Advance Directive Documentation        Most Recent Value   Type of Advance Directive  Living will [at patient's home]   Pre-existing out of facility DNR order (yellow form or pink MOST form)     "MOST" Form in Place?        TOTAL TIME TAKING CARE OF THIS PATIENT: 45 minutes.    @[facility] @  on 04/20/2015 at 2:26 PM  Between 7am to 6pm - Pager - [phone]  After 6pm go to [email or web address] - password [email or web address]  [facility]  [facility]  Office  [phone]  CC: Primary care physician; No primary care provider on file.  Addendum- missed giving prescription Keflex. Called son Mr. Cockram and left a voicemail to pick up prescription from the floor. RN Raquel Sarna notified, saved Keflex prescription to the chart.

## 2015-04-20 NOTE — Discharge Instructions (Signed)
*  Notify your doctor if your symptoms return or become worse.  *Take all medications as prescribed.  *Take your antibiotics until complete even if you are feeling better.  *Notify your doctor with any questions or concerns.

## 2015-04-20 NOTE — Progress Notes (Signed)
A&O. VSS. Tolerating diet well. No complaints of pain or nausea. No stool. Resting comfortably. Discharged per MD orders. Discharge instructions reviewed with pt and son. Pt verbalized understanding. IV's removed per policy. Discharged via wheelchair escorted by nursing staff.

## 2015-05-01 ENCOUNTER — Other Ambulatory Visit: Payer: Self-pay | Admitting: Family Medicine

## 2015-05-01 DIAGNOSIS — R600 Localized edema: Secondary | ICD-10-CM

## 2015-05-02 ENCOUNTER — Ambulatory Visit
Admission: RE | Admit: 2015-05-02 | Discharge: 2015-05-02 | Disposition: A | Discharge status: Home / Self Care | Payer: Medicare Other | Source: Ambulatory Visit | Attending: Family Medicine | Admitting: Family Medicine

## 2015-05-02 DIAGNOSIS — R600 Localized edema: Secondary | ICD-10-CM | POA: Insufficient documentation

## 2016-01-23 ENCOUNTER — Other Ambulatory Visit: Payer: Self-pay | Admitting: Family Medicine

## 2016-01-23 DIAGNOSIS — G459 Transient cerebral ischemic attack, unspecified: Secondary | ICD-10-CM

## 2016-01-27 ENCOUNTER — Ambulatory Visit
Admission: RE | Admit: 2016-01-27 | Discharge: 2016-01-27 | Disposition: A | Discharge status: Home / Self Care | Payer: Medicare Other | Source: Ambulatory Visit | Attending: Family Medicine | Admitting: Family Medicine

## 2016-01-27 DIAGNOSIS — G319 Degenerative disease of nervous system, unspecified: Secondary | ICD-10-CM | POA: Insufficient documentation

## 2016-01-27 DIAGNOSIS — I739 Peripheral vascular disease, unspecified: Secondary | ICD-10-CM | POA: Insufficient documentation

## 2016-01-27 DIAGNOSIS — J3489 Other specified disorders of nose and nasal sinuses: Secondary | ICD-10-CM | POA: Diagnosis not present

## 2016-01-27 DIAGNOSIS — I6521 Occlusion and stenosis of right carotid artery: Secondary | ICD-10-CM | POA: Insufficient documentation

## 2016-01-27 DIAGNOSIS — G459 Transient cerebral ischemic attack, unspecified: Secondary | ICD-10-CM

## 2016-01-27 DIAGNOSIS — I638 Other cerebral infarction: Secondary | ICD-10-CM | POA: Insufficient documentation

## 2016-01-27 DIAGNOSIS — I10 Essential (primary) hypertension: Secondary | ICD-10-CM | POA: Diagnosis not present

## 2016-01-27 DIAGNOSIS — I2510 Atherosclerotic heart disease of native coronary artery without angina pectoris: Secondary | ICD-10-CM | POA: Insufficient documentation

## 2016-01-27 DIAGNOSIS — M4802 Spinal stenosis, cervical region: Secondary | ICD-10-CM | POA: Insufficient documentation

## 2017-06-02 ENCOUNTER — Emergency Department: Payer: Medicare Other

## 2017-06-02 ENCOUNTER — Encounter: Payer: Self-pay | Admitting: Emergency Medicine

## 2017-06-02 ENCOUNTER — Inpatient Hospital Stay
Admission: EM | Admit: 2017-06-02 | Discharge: 2017-06-06 | DRG: 371 | Disposition: A | Discharge status: Home / Self Care | Payer: Medicare Other | Attending: Internal Medicine | Admitting: Internal Medicine

## 2017-06-02 DIAGNOSIS — N184 Chronic kidney disease, stage 4 (severe): Secondary | ICD-10-CM | POA: Diagnosis present

## 2017-06-02 DIAGNOSIS — I129 Hypertensive chronic kidney disease with stage 1 through stage 4 chronic kidney disease, or unspecified chronic kidney disease: Secondary | ICD-10-CM | POA: Diagnosis present

## 2017-06-02 DIAGNOSIS — R197 Diarrhea, unspecified: Secondary | ICD-10-CM

## 2017-06-02 DIAGNOSIS — D631 Anemia in chronic kidney disease: Secondary | ICD-10-CM | POA: Diagnosis present

## 2017-06-02 DIAGNOSIS — A0472 Enterocolitis due to Clostridium difficile, not specified as recurrent: Secondary | ICD-10-CM | POA: Diagnosis present

## 2017-06-02 DIAGNOSIS — R159 Full incontinence of feces: Secondary | ICD-10-CM | POA: Diagnosis present

## 2017-06-02 DIAGNOSIS — I2510 Atherosclerotic heart disease of native coronary artery without angina pectoris: Secondary | ICD-10-CM | POA: Diagnosis present

## 2017-06-02 DIAGNOSIS — N179 Acute kidney failure, unspecified: Secondary | ICD-10-CM

## 2017-06-02 DIAGNOSIS — Z8249 Family history of ischemic heart disease and other diseases of the circulatory system: Secondary | ICD-10-CM

## 2017-06-02 DIAGNOSIS — Z7902 Long term (current) use of antithrombotics/antiplatelets: Secondary | ICD-10-CM | POA: Diagnosis not present

## 2017-06-02 DIAGNOSIS — Z794 Long term (current) use of insulin: Secondary | ICD-10-CM | POA: Diagnosis not present

## 2017-06-02 DIAGNOSIS — Z79899 Other long term (current) drug therapy: Secondary | ICD-10-CM | POA: Diagnosis not present

## 2017-06-02 DIAGNOSIS — Z7982 Long term (current) use of aspirin: Secondary | ICD-10-CM | POA: Diagnosis not present

## 2017-06-02 DIAGNOSIS — H409 Unspecified glaucoma: Secondary | ICD-10-CM | POA: Diagnosis present

## 2017-06-02 DIAGNOSIS — E1122 Type 2 diabetes mellitus with diabetic chronic kidney disease: Secondary | ICD-10-CM | POA: Diagnosis present

## 2017-06-02 DIAGNOSIS — N170 Acute kidney failure with tubular necrosis: Secondary | ICD-10-CM | POA: Diagnosis present

## 2017-06-02 DIAGNOSIS — Z882 Allergy status to sulfonamides status: Secondary | ICD-10-CM | POA: Diagnosis not present

## 2017-06-02 DIAGNOSIS — Z955 Presence of coronary angioplasty implant and graft: Secondary | ICD-10-CM

## 2017-06-02 DIAGNOSIS — Z833 Family history of diabetes mellitus: Secondary | ICD-10-CM | POA: Diagnosis not present

## 2017-06-02 HISTORY — DX: Unspecified glaucoma: H40.9

## 2017-06-02 LAB — COMPREHENSIVE METABOLIC PANEL
ALBUMIN: 3.5 g/dL (ref 3.5–5.0)
ALT: 23 U/L (ref 14–54)
AST: 31 U/L (ref 15–41)
Alkaline Phosphatase: 71 U/L (ref 38–126)
Anion gap: 9 (ref 5–15)
BUN: 54 mg/dL — ABNORMAL HIGH (ref 6–20)
CALCIUM: 8.6 mg/dL — AB (ref 8.9–10.3)
CHLORIDE: 110 mmol/L (ref 101–111)
CO2: 20 mmol/L — AB (ref 22–32)
Creatinine, Ser: 3.02 mg/dL — ABNORMAL HIGH (ref 0.44–1.00)
GFR calc Af Amer: 14 mL/min — ABNORMAL LOW (ref >60)
GFR, EST NON AFRICAN AMERICAN: 12 mL/min — AB (ref >60)
Glucose, Bld: 214 mg/dL — ABNORMAL HIGH (ref 65–99)
POTASSIUM: 4.5 mmol/L (ref 3.5–5.1)
Sodium: 139 mmol/L (ref 135–145)
TOTAL PROTEIN: 6.4 g/dL — AB (ref 6.5–8.1)
Total Bilirubin: 0.7 mg/dL (ref 0.3–1.2)

## 2017-06-02 LAB — GASTROINTESTINAL PANEL BY PCR, STOOL (REPLACES STOOL CULTURE)
ADENOVIRUS F40/41: NOT DETECTED
ASTROVIRUS: NOT DETECTED
CAMPYLOBACTER SPECIES: NOT DETECTED
CYCLOSPORA CAYETANENSIS: NOT DETECTED
Cryptosporidium: NOT DETECTED
ENTEROPATHOGENIC E COLI (EPEC): NOT DETECTED
ENTEROTOXIGENIC E COLI (ETEC): NOT DETECTED
Entamoeba histolytica: NOT DETECTED
Enteroaggregative E coli (EAEC): NOT DETECTED
Giardia lamblia: NOT DETECTED
Norovirus GI/GII: NOT DETECTED
PLESIMONAS SHIGELLOIDES: NOT DETECTED
ROTAVIRUS A: NOT DETECTED
Salmonella species: NOT DETECTED
Sapovirus (I, II, IV, and V): NOT DETECTED
Shiga like toxin producing E coli (STEC): NOT DETECTED
Shigella/Enteroinvasive E coli (EIEC): NOT DETECTED
VIBRIO SPECIES: NOT DETECTED
Vibrio cholerae: NOT DETECTED
Yersinia enterocolitica: NOT DETECTED

## 2017-06-02 LAB — CBC
HCT: 35.1 % (ref 35.0–47.0)
HEMOGLOBIN: 11.5 g/dL — AB (ref 12.0–16.0)
MCH: 28 pg (ref 26.0–34.0)
MCHC: 32.6 g/dL (ref 32.0–36.0)
MCV: 85.8 fL (ref 80.0–100.0)
PLATELETS: 165 10*3/uL (ref 150–440)
RBC: 4.1 MIL/uL (ref 3.80–5.20)
RDW: 14.1 % (ref 11.5–14.5)
WBC: 9.7 10*3/uL (ref 3.6–11.0)

## 2017-06-02 LAB — LIPASE, BLOOD: LIPASE: 38 U/L (ref 11–51)

## 2017-06-02 LAB — APTT: APTT: 34 s (ref 24–36)

## 2017-06-02 LAB — AMMONIA: Ammonia: 30 umol/L (ref 9–35)

## 2017-06-02 LAB — LACTIC ACID, PLASMA
Lactic Acid, Venous: 1.2 mmol/L (ref 0.5–1.9)
Lactic Acid, Venous: 0.9 mmol/L (ref 0.5–1.9)

## 2017-06-02 LAB — C DIFFICILE QUICK SCREEN W PCR REFLEX
C DIFFICLE (CDIFF) ANTIGEN: POSITIVE — AB
C Diff toxin: NEGATIVE

## 2017-06-02 LAB — GLUCOSE, CAPILLARY: GLUCOSE-CAPILLARY: 211 mg/dL — AB (ref 65–99)

## 2017-06-02 LAB — CLOSTRIDIUM DIFFICILE BY PCR: Toxigenic C. Difficile by PCR: POSITIVE — AB

## 2017-06-02 LAB — PROTIME-INR
INR: 1.31
Prothrombin Time: 16.4 seconds — ABNORMAL HIGH (ref 11.4–15.2)

## 2017-06-02 LAB — MAGNESIUM: MAGNESIUM: 1.9 mg/dL (ref 1.7–2.4)

## 2017-06-02 MED ORDER — HEPARIN SODIUM (PORCINE) 5000 UNIT/ML IJ SOLN
5000.0000 [IU] | Freq: Three times a day (TID) | INTRAMUSCULAR | Status: DC
  Administered 2017-06-02 – 2017-06-06 (×11): 5000 [IU] via SUBCUTANEOUS
  Filled 2017-06-02 (×11): qty 1

## 2017-06-02 MED ORDER — TIMOLOL MALEATE 0.5 % OP SOLN
1.0000 [drp] | Freq: Two times a day (BID) | OPHTHALMIC | Status: DC
  Administered 2017-06-02 – 2017-06-06 (×9): 1 [drp] via OPHTHALMIC
  Filled 2017-06-02: qty 5

## 2017-06-02 MED ORDER — FERROUS SULFATE 325 (65 FE) MG PO TABS
325.0000 mg | ORAL_TABLET | Freq: Two times a day (BID) | ORAL | Status: DC
  Administered 2017-06-03 – 2017-06-06 (×7): 325 mg via ORAL
  Filled 2017-06-02 (×6): qty 1

## 2017-06-02 MED ORDER — MORPHINE SULFATE (PF) 2 MG/ML IV SOLN
2.0000 mg | Freq: Once | INTRAVENOUS | Status: AC
  Administered 2017-06-02: 2 mg via INTRAVENOUS
  Filled 2017-06-02: qty 1

## 2017-06-02 MED ORDER — CIPROFLOXACIN IN D5W 400 MG/200ML IV SOLN
400.0000 mg | INTRAVENOUS | Status: DC
  Administered 2017-06-02: 400 mg via INTRAVENOUS
  Filled 2017-06-02 (×2): qty 200

## 2017-06-02 MED ORDER — SODIUM CHLORIDE 0.9 % IV SOLN
INTRAVENOUS | Status: DC
  Administered 2017-06-03: 21:00:00 via INTRAVENOUS

## 2017-06-02 MED ORDER — AMLODIPINE BESYLATE 5 MG PO TABS
10.0000 mg | ORAL_TABLET | Freq: Every day | ORAL | Status: DC
  Administered 2017-06-03 – 2017-06-06 (×3): 10 mg via ORAL
  Filled 2017-06-02 (×4): qty 2

## 2017-06-02 MED ORDER — ACETAMINOPHEN 325 MG PO TABS: 650.0000 mg | ORAL_TABLET | Freq: Four times a day (QID) | ORAL | Status: DC | PRN

## 2017-06-02 MED ORDER — TRAZODONE HCL 50 MG PO TABS: 25.0000 mg | ORAL_TABLET | Freq: Every evening | ORAL | Status: DC | PRN

## 2017-06-02 MED ORDER — HYDRALAZINE HCL 50 MG PO TABS
50.0000 mg | ORAL_TABLET | Freq: Two times a day (BID) | ORAL | Status: DC
  Administered 2017-06-02 – 2017-06-06 (×5): 50 mg via ORAL
  Filled 2017-06-02 (×6): qty 1

## 2017-06-02 MED ORDER — PRAVASTATIN SODIUM 80 MG PO TABS
80.0000 mg | ORAL_TABLET | Freq: Every day | ORAL | Status: DC
  Administered 2017-06-03 – 2017-06-06 (×5): 80 mg via ORAL
  Filled 2017-06-02 (×4): qty 1

## 2017-06-02 MED ORDER — CLONIDINE HCL 0.3 MG/24HR TD PTWK
0.3000 mg | MEDICATED_PATCH | TRANSDERMAL | Status: DC
  Filled 2017-06-02: qty 1

## 2017-06-02 MED ORDER — METRONIDAZOLE 500 MG PO TABS
500.0000 mg | ORAL_TABLET | Freq: Three times a day (TID) | ORAL | Status: DC
Start: 2017-06-02 — End: 2017-06-02

## 2017-06-02 MED ORDER — SODIUM CHLORIDE 0.9 % IV BOLUS (SEPSIS)
500.0000 mL | INTRAVENOUS | Status: AC
  Administered 2017-06-02: 500 mL via INTRAVENOUS

## 2017-06-02 MED ORDER — GLIPIZIDE 2.5 MG HALF TABLET
2.5000 mg | ORAL_TABLET | Freq: Two times a day (BID) | ORAL | Status: DC
  Administered 2017-06-02 – 2017-06-04 (×4): 2.5 mg via ORAL
  Filled 2017-06-02 (×5): qty 1

## 2017-06-02 MED ORDER — ACETAMINOPHEN 650 MG RE SUPP: 650.0000 mg | Freq: Four times a day (QID) | RECTAL | Status: DC | PRN

## 2017-06-02 MED ORDER — LINAGLIPTIN 5 MG PO TABS
5.0000 mg | ORAL_TABLET | Freq: Every day | ORAL | Status: DC
  Administered 2017-06-03 – 2017-06-06 (×5): 5 mg via ORAL
  Filled 2017-06-02 (×4): qty 1

## 2017-06-02 MED ORDER — ONDANSETRON HCL 4 MG/2ML IJ SOLN: 4.0000 mg | Freq: Four times a day (QID) | INTRAMUSCULAR | Status: DC | PRN

## 2017-06-02 MED ORDER — ONDANSETRON HCL 4 MG/2ML IJ SOLN
4.0000 mg | INTRAMUSCULAR | Status: AC
  Administered 2017-06-02: 4 mg via INTRAVENOUS
  Filled 2017-06-02: qty 2

## 2017-06-02 MED ORDER — CARVEDILOL 25 MG PO TABS
25.0000 mg | ORAL_TABLET | Freq: Two times a day (BID) | ORAL | Status: DC
  Administered 2017-06-02 – 2017-06-06 (×6): 25 mg via ORAL
  Filled 2017-06-02 (×8): qty 1

## 2017-06-02 MED ORDER — ONDANSETRON HCL 4 MG PO TABS: 4.0000 mg | ORAL_TABLET | Freq: Four times a day (QID) | ORAL | Status: DC | PRN

## 2017-06-02 MED ORDER — FAMOTIDINE 20 MG PO TABS
10.0000 mg | ORAL_TABLET | Freq: Every day | ORAL | Status: DC
  Administered 2017-06-02 – 2017-06-06 (×5): 10 mg via ORAL
  Filled 2017-06-02 (×5): qty 1

## 2017-06-02 MED ORDER — ASPIRIN EC 81 MG PO TBEC
81.0000 mg | DELAYED_RELEASE_TABLET | Freq: Every day | ORAL | Status: DC
  Administered 2017-06-03 – 2017-06-04 (×2): 81 mg via ORAL
  Filled 2017-06-02 (×2): qty 1

## 2017-06-02 MED ORDER — CLOPIDOGREL BISULFATE 75 MG PO TABS
75.0000 mg | ORAL_TABLET | Freq: Every day | ORAL | Status: DC
  Administered 2017-06-03 – 2017-06-04 (×2): 75 mg via ORAL
  Filled 2017-06-02 (×2): qty 1

## 2017-06-02 MED ORDER — VANCOMYCIN 50 MG/ML ORAL SOLUTION
125.0000 mg | Freq: Four times a day (QID) | ORAL | Status: DC
  Administered 2017-06-03 (×2): 125 mg via ORAL
  Filled 2017-06-02 (×4): qty 2.5

## 2017-06-02 MED ORDER — SODIUM CHLORIDE 0.9 % IV SOLN: Freq: Once | INTRAVENOUS | Status: DC

## 2017-06-02 MED ORDER — SACCHAROMYCES BOULARDII 250 MG PO CAPS
250.0000 mg | ORAL_CAPSULE | Freq: Two times a day (BID) | ORAL | Status: DC
  Administered 2017-06-02 – 2017-06-06 (×8): 250 mg via ORAL
  Filled 2017-06-02 (×9): qty 1

## 2017-06-02 MED ORDER — INSULIN ASPART 100 UNIT/ML ~~LOC~~ SOLN
0.0000 [IU] | Freq: Three times a day (TID) | SUBCUTANEOUS | Status: DC
  Administered 2017-06-03: 3 [IU] via SUBCUTANEOUS
  Filled 2017-06-02: qty 1

## 2017-06-02 NOTE — Progress Notes (Signed)
ANTIBIOTIC CONSULT NOTE - INITIAL  Pharmacy Consult for Ciprofloxacin  Indication: intra-abdominal infection  Allergies  Allergen Reactions  . Sulfa Antibiotics     Patient Measurements: Height: 5\' 8"  (172.7 cm) Weight: 190 lb (86.2 kg) IBW/kg (Calculated) : 63.9 Adjusted Body Weight:   Vital Signs: Temp: 98.8 F (37.1 C) (08/22 2011) Temp Source: Oral (08/22 1417) BP: 151/54 (08/22 2011) Pulse Rate: 73 (08/22 2011) Intake/Output from previous day: No intake/output data recorded. Intake/Output from this shift: No intake/output data recorded.  Labs:  Recent Labs  06/02/17 1459  WBC 9.7  HGB 11.5*  PLT 165  CREATININE 3.02*   Estimated Creatinine Clearance: 13.1 mL/min (A) (by C-G formula based on SCr of 3.02 mg/dL (H)). No results for input(s): VANCOTROUGH, VANCOPEAK, VANCORANDOM, GENTTROUGH, GENTPEAK, GENTRANDOM, TOBRATROUGH, TOBRAPEAK, TOBRARND, AMIKACINPEAK, AMIKACINTROU, AMIKACIN in the last 72 hours.   Microbiology: Recent Results (from the past 720 hour(s))  C difficile quick scan w PCR reflex     Status: Abnormal   Collection Time: 06/02/17  2:59 PM  Result Value Ref Range Status   C Diff antigen POSITIVE (A) NEGATIVE Final   C Diff toxin NEGATIVE NEGATIVE Final   C Diff interpretation Results are indeterminate. See PCR results.  Final  Gastrointestinal Panel by PCR , Stool     Status: None   Collection Time: 06/02/17  2:59 PM  Result Value Ref Range Status   Campylobacter species NOT DETECTED NOT DETECTED Final   Plesimonas shigelloides NOT DETECTED NOT DETECTED Final   Salmonella species NOT DETECTED NOT DETECTED Final   Yersinia enterocolitica NOT DETECTED NOT DETECTED Final   Vibrio species NOT DETECTED NOT DETECTED Final   Vibrio cholerae NOT DETECTED NOT DETECTED Final   Enteroaggregative E coli (EAEC) NOT DETECTED NOT DETECTED Final   Enteropathogenic E coli (EPEC) NOT DETECTED NOT DETECTED Final   Enterotoxigenic E coli (ETEC) NOT DETECTED NOT  DETECTED Final   Shiga like toxin producing E coli (STEC) NOT DETECTED NOT DETECTED Final   Shigella/Enteroinvasive E coli (EIEC) NOT DETECTED NOT DETECTED Final   Cryptosporidium NOT DETECTED NOT DETECTED Final   Cyclospora cayetanensis NOT DETECTED NOT DETECTED Final   Entamoeba histolytica NOT DETECTED NOT DETECTED Final   Giardia lamblia NOT DETECTED NOT DETECTED Final   Adenovirus F40/41 NOT DETECTED NOT DETECTED Final   Astrovirus NOT DETECTED NOT DETECTED Final   Norovirus GI/GII NOT DETECTED NOT DETECTED Final   Rotavirus A NOT DETECTED NOT DETECTED Final   Sapovirus (I, II, IV, and V) NOT DETECTED NOT DETECTED Final  Clostridium Difficile by PCR     Status: Abnormal   Collection Time: 06/02/17  2:59 PM  Result Value Ref Range Status   Toxigenic C Difficile by pcr POSITIVE (A) NEGATIVE Final    Comment: Positive for toxigenic C. difficile with little to no toxin production. Only treat if clinical presentation suggests symptomatic illness.    Medical History: Past Medical History:  Diagnosis Date  . Coronary artery disease   . Diabetes mellitus without complication ([place])   . Glaucoma   . Hypertension   . Renal disorder     Medications:  Scheduled:  . [START ON 06/03/2017] amLODipine  10 mg Oral Daily  . [START ON 06/03/2017] aspirin EC  81 mg Oral Daily  . carvedilol  25 mg Oral BID  . cloNIDine  0.3 mg Transdermal Weekly  . [START ON 06/03/2017] clopidogrel  75 mg Oral Daily  . famotidine  10 mg Oral Daily  .  ferrous sulfate  325 mg Oral BID WC  . glipiZIDE  2.5 mg Oral BID AC  . heparin  5,000 Units Subcutaneous Q8H  . hydrALAZINE  50 mg Oral BID  . [START ON 06/03/2017] insulin aspart  0-9 Units Subcutaneous TID WC  . [START ON 06/03/2017] linagliptin  5 mg Oral Daily  . [START ON 06/03/2017] pravastatin  80 mg Oral Daily  . saccharomyces boulardii  250 mg Oral BID  . timolol  1 drop Both Eyes BID  . vancomycin  125 mg Oral Q6H   Assessment: CrCl = 13.1 ml/min    Goal of Therapy:  resolution of infection   Plan:  Expected duration 7 days with resolution of temperature and/or normalization of WBC   Will order Ciprofloxacin 400 mg IV Q24H to start on 8/22 @ 22:00.   Sonia Simpson D 06/02/2017,9:54 PM

## 2017-06-02 NOTE — ED Notes (Signed)
Pt has had multiple episodes of stool incontinence, stool is noted to be watery in nature with chunks of undigested food. Pt is alert, c/o abdominal cramping. This RN and Stanton Kidney, RN, and [place], Medic, cleaned patient on several different occassions. Spoke with MD regarding fecal management system due to multiple episodes of large amounts of incontinence within an hour of patient arrival.

## 2017-06-02 NOTE — ED Notes (Signed)
Fecal management system placed by [place], RN

## 2017-06-02 NOTE — ED Provider Notes (Signed)
[facility] Emergency Department Provider Note  ____________________________________________   First MD Initiated Contact with Patient 06/02/17 1358     (approximate)  I have reviewed the triage vital signs and the nursing notes.   HISTORY  Chief Complaint Nausea; Emesis; and Diarrhea    HPI Sonia Simpson is a 83 y.o. female who presents by EMS for evaluation of acute onset lower abdominal cramping, copious watery diarrhea with bowel incontinence, and some nausea and one episode of vomiting.  She reports she was in her usual state of health until this morning.  When she woke up this morning she became nauseated and vomited and felt urgency as if she needed to have a bowel movement but initially was unable to do so.  She sat on the toilet for a while and eventually she did start having copious watery dark colored bowel movements but then she reports that they would not stop.  She has intermittent severe lower abdominal cramping but no sharp pain.  She has had no additional vomiting.  She had several episodes of diarrhea and bowel incontinence during transit by EMS and had at least 3 episodes within 45 minutes upon arrival to the ED.  The symptoms are severe.  Nothing in particular makes the patient's symptoms better nor worse.  She denies recent fever/chills, chest pain, shortness of breath, dysuria.  She reports that she has not recently taken any antibiotics and she has not taken any stool softeners.  She reports a vague history of lower GI bleeding but cannot provide any specifics.  She is diabetic but no longer takes insulin.  She reports being on both aspirin and Plavix.  Past Medical History:  Diagnosis Date  . Coronary artery disease   . Diabetes mellitus without complication ([place])   . Glaucoma   . Hypertension   . Renal disorder     Patient Active Problem List   Diagnosis Date Noted  . Clostridium difficile diarrhea 06/02/2017  . Bright red rectal  bleeding 04/18/2015  . Colitis 04/18/2015  . UTI (urinary tract infection) 04/18/2015  . HTN (hypertension) 04/18/2015  . DM (diabetes mellitus) ([place]) 04/18/2015  . CAD (coronary artery disease) 04/18/2015    Past Surgical History:  Procedure Laterality Date  . CORONARY ANGIOPLASTY WITH STENT PLACEMENT     2009    Prior to Admission medications   Medication Sig Start Date End Date Taking? Authorizing Provider  amLODipine (NORVASC) 10 MG tablet Take 1 tablet by mouth daily. 01/29/14  Yes [provider]  aspirin 81 MG tablet Take 81 mg by mouth daily.   Yes [provider]  carvedilol (COREG) 25 MG tablet Take 1 tablet by mouth 2 (two) times daily.    Yes [provider]  cloNIDine (CATAPRES - DOSED IN MG/24 HR) 0.3 mg/24hr patch Place 1 patch onto the skin once a week.   Yes [provider]  clopidogrel (PLAVIX) 75 MG tablet Take 75 mg by mouth daily.   Yes [provider]  ferrous sulfate 325 (65 FE) MG tablet Take 325 mg by mouth 2 (two) times daily with a meal.   Yes [provider]  glipiZIDE (GLUCOTROL) 10 MG tablet Take 1 tablet by mouth 2 (two) times daily.   Yes [provider]  hydrALAZINE (APRESOLINE) 50 MG tablet Take 50 mg by mouth 2 (two) times daily.   Yes [provider]  linagliptin (TRADJENTA) 5 MG TABS tablet Take 5 mg by mouth daily.  Yes [provider]  lisinopril (PRINIVIL,ZESTRIL) 20 MG tablet Take 1 tablet by mouth daily.    Yes [provider]  pravastatin (PRAVACHOL) 80 MG tablet Take 80 mg by mouth daily.   Yes [provider]  timolol (BETIMOL) 0.5 % ophthalmic solution Place 1 drop into both eyes 2 (two) times daily.   Yes [provider]  cephALEXin (KEFLEX) 500 MG capsule Take 1 capsule (500 mg total) by mouth 2 (two) times daily. Patient not taking: Reported on 06/02/2017 04/20/15   Nicholes Mango, MD  ciprofloxacin (CIPRO) 500 MG tablet Take 1 tablet  (500 mg total) by mouth 2 (two) times daily. Patient not taking: Reported on 06/02/2017 04/20/15   Nicholes Mango, MD  furosemide (LASIX) 40 MG tablet Take 1.5 tablets by mouth daily. 04/30/15   [provider]  hydrALAZINE (APRESOLINE) 25 MG tablet Take 1 tablet (25 mg total) by mouth every 8 (eight) hours. Patient not taking: Reported on 06/02/2017 04/20/15   Nicholes Mango, MD  insulin glargine (LANTUS) 100 UNIT/ML injection Inject 0.08 mLs (8 Units total) into the skin daily. Patient not taking: Reported on 06/02/2017 04/20/15   Nicholes Mango, MD  metroNIDAZOLE (FLAGYL) 500 MG tablet Take 1 tablet (500 mg total) by mouth 3 (three) times daily. Patient not taking: Reported on 06/02/2017 04/20/15   Nicholes Mango, MD  Praziquantel POWD Take 850 mg by mouth once. Patient not taking: Reported on 06/02/2017 04/20/15   Nicholes Mango, MD  ranitidine (ZANTAC) 150 MG tablet Take 150 mg by mouth 2 (two) times daily.    [provider]  saccharomyces boulardii (FLORASTOR) 250 MG capsule Take 1 capsule (250 mg total) by mouth 2 (two) times daily. Patient not taking: Reported on 06/02/2017 04/20/15   Nicholes Mango, MD    Allergies Sulfa antibiotics  Family History  Problem Relation Age of Onset  . Diabetes Mellitus II Mother   . Hypertension Father   . Diabetes type II Sister   . Diabetes type II Brother     Social History Social History  Substance Use Topics  . Smoking status: Never Smoker  . Smokeless tobacco: Never Used  . Alcohol use No    Review of Systems Constitutional: No fever/chills Eyes: No visual changes. ENT: No sore throat. Cardiovascular: Denies chest pain. Respiratory: Denies shortness of breath. Gastrointestinal: Acute onset intermittent severe lower abdominal cramping with copious and near constant watery but dark diarrhea and bowel incontinence.  Nausea and emesis x 1. Genitourinary: Negative for dysuria. Musculoskeletal: Negative for neck pain.  Negative for back  pain. Integumentary: Negative for rash. Neurological: Negative for headaches, focal weakness or numbness.   ____________________________________________   PHYSICAL EXAM:  VITAL SIGNS: ED Triage Vitals  Enc Vitals Group     BP 06/02/17 1417 (!) 149/60     Pulse Rate 06/02/17 1417 78     Resp 06/02/17 1417 (!) 22     Temp 06/02/17 1417 (!) 97.5 F (36.4 C)     Temp Source 06/02/17 1417 Oral     SpO2 06/02/17 1417 100 %     Weight 06/02/17 1409 86.2 kg (190 lb)     Height 06/02/17 1409 1.727 m (5\' 8" )     Head Circumference --      Peak Flow --      Pain Score 06/02/17 1408 7     Pain Loc --      Pain Edu? --      Excl. in [place]? --  Constitutional: Alert and oriented. Moderate distress from GI discomfort Eyes: Conjunctivae are normal.  Head: Atraumatic. Nose: No congestion/rhinnorhea. Mouth/Throat: Mucous membranes are moist. Neck: No stridor.  No meningeal signs.   Cardiovascular: Normal rate, regular rhythm. Good peripheral circulation. Grossly normal heart sounds. Respiratory: Normal respiratory effort.  No retractions. Lungs CTAB. Gastrointestinal: Soft and nondistended.  Mild diffuse tenderness throughout the lower abdomen but it is very minimal with no rebound and no guarding.  However the patient was actively suffering from bowel incontinence while I was in the room.  Hemoccult strongly positive with quality control passed. Musculoskeletal: No lower extremity tenderness nor edema. No gross deformities of extremities. Neurologic:  Normal speech and language. No gross focal neurologic deficits are appreciated.  Skin:  Skin is warm, dry and intact. No rash noted. Psychiatric: Mood and affect are normal. Speech and behavior are normal.  ____________________________________________   LABS (all labs ordered are listed, but only abnormal results are displayed)  Labs Reviewed  C DIFFICILE QUICK SCREEN W PCR REFLEX - Abnormal; Notable for the following:       Result  Value   C Diff antigen POSITIVE (*)    All other components within normal limits  CLOSTRIDIUM DIFFICILE BY PCR - Abnormal; Notable for the following:    Toxigenic C Difficile by pcr POSITIVE (*)    All other components within normal limits  COMPREHENSIVE METABOLIC PANEL - Abnormal; Notable for the following:    CO2 20 (*)    Glucose, Bld 214 (*)    BUN 54 (*)    Creatinine, Ser 3.02 (*)    Calcium 8.6 (*)    Total Protein 6.4 (*)    GFR calc non Af Amer 12 (*)    GFR calc Af Amer 14 (*)    All other components within normal limits  CBC - Abnormal; Notable for the following:    Hemoglobin 11.5 (*)    All other components within normal limits  PROTIME-INR - Abnormal; Notable for the following:    Prothrombin Time 16.4 (*)    All other components within normal limits  GLUCOSE, CAPILLARY - Abnormal; Notable for the following:    Glucose-Capillary 211 (*)    All other components within normal limits  GASTROINTESTINAL PANEL BY PCR, STOOL (REPLACES STOOL CULTURE)  LIPASE, BLOOD  LACTIC ACID, PLASMA  LACTIC ACID, PLASMA  MAGNESIUM  AMMONIA  APTT  URINALYSIS, COMPLETE (UACMP) WITH MICROSCOPIC  BASIC METABOLIC PANEL  CBC   ____________________________________________  EKG  EKG not ordered nor indicated as part of ED workup ____________________________________________  RADIOLOGY   Ct Abdomen Pelvis Wo Contrast  Result Date: 06/02/2017 CLINICAL DATA:  82 y/o F; stool incontinence and abdominal cramping. EXAM: CT ABDOMEN AND PELVIS WITHOUT CONTRAST TECHNIQUE: Multidetector CT imaging of the abdomen and pelvis was performed following the standard protocol without IV contrast. COMPARISON:  04/17/2015 CT abdomen and pelvis FINDINGS: Lower chest: Right lower lobe basal posterior segment bronchiectasis and ground-glass opacifications. Severe coronary artery calcification. Hepatobiliary: Punctate nonspecific calcification within the right lobe of liver. No other focal liver lesion.  Gallstones. No intra or extrahepatic biliary ductal dilatation. Pancreas: Unremarkable. No pancreatic ductal dilatation or surrounding inflammatory changes. Spleen: Normal in size without focal abnormality. Adrenals/Urinary Tract: Adrenal glands are unremarkable. Kidneys are normal, without renal calculi, focal lesion, or hydronephrosis. Bladder is unremarkable. Stomach/Bowel: Pan colonic moderate wall thickening and pericolic edema. Extensive diverticulosis of sigmoid colon. Rectal tube noted. No obstructive changes of small bowel. Diffuse fecalization of small  bowel contents. Small hiatal hernia. Vascular/Lymphatic: Aortic atherosclerosis with severe calcification. No enlarged abdominal or pelvic lymph nodes. Reproductive: Myomatous uterus with the largest myoma within the dorsal fundus measuring up to 5.9 cm, densely calcified. Other: No abdominal wall hernia or abnormality. Musculoskeletal: Severe osteoarthrosis of the right hip joint. Mild S-shaped curvature of the thoracolumbar spine. L1 mild anterior compression deformity at the is stable. Stable grade 1 L4-5 anterolisthesis. IMPRESSION: 1. Moderate diffuse pancolitis may be infectious, inflammatory, or ischemic. No evidence for perforation or abscess. Extensive pericolonic edema and small volume of ascites. 2. Fecalization of small bowel contents probably represents chronic dysmotility. 3. Bronchiectasis and ground-glass opacification in right lower lobe may be related to chronic bronchitis, possibly aspiration given distribution. 4. Gallstones. 5. Severe calcific atherosclerosis of aorta. 6. Myomatous uterus. 7. Severe right hip osteoarthrosis. Electronically Signed   By: Kristine Garbe M.D.   On: 06/02/2017 17:50    ____________________________________________   PROCEDURES  Critical Care performed: No   Procedure(s) performed:   Procedures   ____________________________________________   INITIAL IMPRESSION / ASSESSMENT AND PLAN  / ED COURSE  Pertinent labs & imaging results that were available during my care of the patient were reviewed by me and considered in my medical decision making (see chart for details).     Clinical Course as of Jun 02 2320  Wed Jun 02, 2017  1444 The patient is suffering from severe, requiring, and profuse watery diarrhea and near complete bowel incontinence.  Over the last hour she has had at least 3 uncontrollable bowel movements.  I discussed with her the possibility of a rectal tube/fecal management system, and she said that is fine with her because she cannot continue on the way she has been doing.  Under the circumstances I think it is appropriate and I have ordered a flexible sigmoidoscopy of fecal management system.  For inpatient treatment, there is an indication that the patient must be immobile, but in this situation it is better for the patient to proceed with a management system that is less likely to result in skin breakdown which may lead to additional infection and that we will provide her some degree of relief and allow [place] to continue with her medical screening exam to try to determine the source of her symptoms.In spite of her symptoms and her severe but intermittent cramping, she has only very mild diffuse tenderness to palpation of the abdomen with no distention.  Her symptoms all started within the last 8 hours or so.  I suspect she is suffering from a severe viral gastroenteritis although I will evaluate further to try to determine if she may have a severe intra-abdominal pathology.  Her blood pressure is normal in spite of her symptoms and I strongly doubt mesenteric ischemia.  I will determine after her symptoms are somewhat under control if she would benefit from advanced imaging.  [CF]  6962 Patient continues to output dark and watery stool into the fecal management system.  She continues to have cramps and some vague and diffuse lower abdominal pain.  I am going to give her an  IV fluid bolus given the amount of output, some Zofran, and a small dose of morphine to help with the discomfort.  We checked the stool in the management system bag and it is strongly heme positive.  There does appear to be some flecks of blood in the bag but there is no gross blood.  It is concerning however that she does have strongly  positive and very profuse diarrhea.  I am awaiting the results of her lab work to determine if she would be able to tolerate intravenous contrast for a CT scan of her abdomen to determine if there is any evidence of ischemic colitis.  [CF]  0160 Equivocal results, will treat empirically based on clinical presentation.  C Diff antigen: (!) POSITIVE [CF]  1716 CT pending, but will need admission regardless.  Discussed in person with Dr. Vianne Bulls who will admit.  [CF]    Clinical Course User Index [CF] Hinda Kehr, MD    ____________________________________________  FINAL CLINICAL IMPRESSION(S) / ED DIAGNOSES  Final diagnoses:  Acute renal failure, unspecified acute renal failure type ([place])  Intractable diarrhea  C. difficile diarrhea     MEDICATIONS GIVEN DURING THIS VISIT:  Medications  0.9 %  sodium chloride infusion ( Intravenous Rate/Dose Change 06/02/17 1858)  amLODipine (NORVASC) tablet 10 mg (not administered)  cloNIDine (CATAPRES - Dosed in mg/24 hr) patch 0.3 mg (not administered)  ferrous sulfate tablet 325 mg (not administered)  hydrALAZINE (APRESOLINE) tablet 50 mg (50 mg Oral Given 06/02/17 2105)  linagliptin (TRADJENTA) tablet 5 mg (not administered)  pravastatin (PRAVACHOL) tablet 80 mg (not administered)  aspirin EC tablet 81 mg (not administered)  timolol (TIMOPTIC) 0.5 % ophthalmic solution 1 drop (1 drop Both Eyes Given 06/02/17 2106)  famotidine (PEPCID) tablet 10 mg (10 mg Oral Given 06/02/17 2111)  carvedilol (COREG) tablet 25 mg (25 mg Oral Given 06/02/17 2105)  clopidogrel (PLAVIX) tablet 75 mg (not administered)  saccharomyces  boulardii (FLORASTOR) capsule 250 mg (250 mg Oral Given 06/02/17 2106)  heparin injection 5,000 Units (5,000 Units Subcutaneous Given 06/02/17 2105)  0.9 %  sodium chloride infusion (not administered)  acetaminophen (TYLENOL) tablet 650 mg (not administered)    Or  acetaminophen (TYLENOL) suppository 650 mg (not administered)  traZODone (DESYREL) tablet 25 mg (not administered)  ondansetron (ZOFRAN) tablet 4 mg (not administered)    Or  ondansetron (ZOFRAN) injection 4 mg (not administered)  vancomycin (VANCOCIN) 50 mg/mL oral solution 125 mg (not administered)  insulin aspart (novoLOG) injection 0-9 Units (not administered)  glipiZIDE (GLUCOTROL) tablet 2.5 mg (2.5 mg Oral Given 06/02/17 2106)  ciprofloxacin (CIPRO) IVPB 400 mg (400 mg Intravenous New Bag/Given 06/02/17 2238)  sodium chloride 0.9 % bolus 500 mL (500 mLs Intravenous New Bag/Given 06/02/17 1625)  morphine 2 MG/ML injection 2 mg (2 mg Intravenous Given 06/02/17 1625)  ondansetron (ZOFRAN) injection 4 mg (4 mg Intravenous Given 06/02/17 1625)     NEW OUTPATIENT MEDICATIONS STARTED DURING THIS VISIT:  Current Discharge Medication List      Current Discharge Medication List      Current Discharge Medication List       Note:  This document was prepared using Dragon voice recognition software and may include unintentional dictation errors.    Hinda Kehr, MD 06/02/17 [phone]

## 2017-06-02 NOTE — H&P (Signed)
[facility] [place] NAME: Sonia Simpson    MR#:  [id number]  DATE OF BIRTH:  [birth year]  DATE OF ADMISSION:  06/02/2017  PRIMARY CARE PHYSICIAN: Marguerita Merles, MD   REQUESTING/REFERRING PHYSICIAN: dr.Forbach  CHIEF COMPLAINT: Diarrhea    Chief Complaint  Patient presents with  . Nausea  . Emesis  . Diarrhea    HISTORY OF PRESENT ILLNESS:  Sonia Simpson  is a 83 y.o. female with a known history ofHypertension, diabetes mellitus type 2 presented to ED because of severe diarrhea. Patient has has been having multiple loose stools with incontinence, watery stools with chunks of undigested food. Patient also had abdominal cramps. Rectal tube was inserted. No fevers. PAST MEDICAL HISTORY:   Past Medical History:  Diagnosis Date  . Coronary artery disease   . Diabetes mellitus without complication ([place])   . Glaucoma   . Hypertension   . Renal disorder     PAST SURGICAL HISTOIRY:   Past Surgical History:  Procedure Laterality Date  . CORONARY ANGIOPLASTY WITH STENT PLACEMENT     2009    SOCIAL HISTORY:   Social History  Substance Use Topics  . Smoking status: Never Smoker  . Smokeless tobacco: Never Used  . Alcohol use No    FAMILY HISTORY:   Family History  Problem Relation Age of Onset  . Diabetes Mellitus II Mother   . Hypertension Father   . Diabetes type II Sister   . Diabetes type II Brother     DRUG ALLERGIES:   Allergies  Allergen Reactions  . Sulfa Antibiotics     REVIEW OF SYSTEMS:  CONSTITUTIONAL: No fever, fatigue or weakness.mucous . Membranes are dry. EYES: No blurred or double vision.  EARS, NOSE, AND THROAT: No tinnitus or ear pain.  RESPIRATORY: No cough, shortness of breath, wheezing or hemoptysis.  CARDIOVASCULAR: No chest pain, orthopnea, edema.  GASTROINTESTINAL: Patient is nauseous. Has abdominal cramps and also multiple episodes of loose stools and also incontinence of bowels  rectal tube is inserted in the emergency room.Marland Kitchen GENITOURINARY: No dysuria, hematuria.  ENDOCRINE: No polyuria, nocturia,  HEMATOLOGY: No anemia, easy bruising or bleeding SKIN: No rash or lesion. MUSCULOSKELETAL: No joint pain or arthritis.   NEUROLOGIC: No tingling, numbness, weakness.  PSYCHIATRY: No anxiety or depression.   MEDICATIONS AT HOME:   Prior to Admission medications   Medication Sig Start Date End Date Taking? Authorizing Provider  amLODipine (NORVASC) 10 MG tablet Take 1 tablet by mouth daily. 01/29/14  Yes [provider]  aspirin 81 MG tablet Take 81 mg by mouth daily.   Yes [provider]  carvedilol (COREG) 25 MG tablet Take 1 tablet by mouth 2 (two) times daily.    Yes [provider]  cloNIDine (CATAPRES - DOSED IN MG/24 HR) 0.3 mg/24hr patch Place 1 patch onto the skin once a week.   Yes [provider]  clopidogrel (PLAVIX) 75 MG tablet Take 75 mg by mouth daily.   Yes [provider]  ferrous sulfate 325 (65 FE) MG tablet Take 325 mg by mouth 2 (two) times daily with a meal.   Yes [provider]  furosemide (LASIX) 40 MG tablet Take 1.5 tablets by mouth daily. 04/30/15  Yes [provider]  glipiZIDE (GLUCOTROL) 10 MG tablet Take 1 tablet by mouth 2 (two) times daily.   Yes [provider]  hydrALAZINE (APRESOLINE) 50 MG tablet Take 50 mg by mouth 2 (  two) times daily.   Yes [provider]  insulin glargine (LANTUS) 100 UNIT/ML injection Inject 0.08 mLs (8 Units total) into the skin daily. 04/20/15  Yes Gouru, Aruna, MD  insulin glargine (LANTUS) 100 UNIT/ML injection Inject 52 Units into the skin at bedtime. 05/03/12  Yes [provider]  linagliptin (TRADJENTA) 5 MG TABS tablet Take 5 mg by mouth daily.   Yes [provider]  lisinopril (PRINIVIL,ZESTRIL) 20 MG tablet Take 1 tablet by mouth daily.    Yes [provider]  pravastatin (PRAVACHOL) 80 MG tablet Take  80 mg by mouth daily.   Yes [provider]  ranitidine (ZANTAC) 150 MG tablet Take 150 mg by mouth 2 (two) times daily.   Yes [provider]  timolol (BETIMOL) 0.5 % ophthalmic solution Place 1 drop into both eyes 2 (two) times daily.   Yes [provider]  cephALEXin (KEFLEX) 500 MG capsule Take 1 capsule (500 mg total) by mouth 2 (two) times daily. 04/20/15   Nicholes Mango, MD  ciprofloxacin (CIPRO) 500 MG tablet Take 1 tablet (500 mg total) by mouth 2 (two) times daily. 04/20/15   Nicholes Mango, MD  hydrALAZINE (APRESOLINE) 25 MG tablet Take 1 tablet (25 mg total) by mouth every 8 (eight) hours. Patient not taking: Reported on 06/02/2017 04/20/15   Nicholes Mango, MD  metroNIDAZOLE (FLAGYL) 500 MG tablet Take 1 tablet (500 mg total) by mouth 3 (three) times daily. 04/20/15   Nicholes Mango, MD  Praziquantel POWD Take 850 mg by mouth once. 04/20/15   Nicholes Mango, MD  saccharomyces boulardii (FLORASTOR) 250 MG capsule Take 1 capsule (250 mg total) by mouth 2 (two) times daily. 04/20/15   Gouru, Illene Silver, MD      VITAL SIGNS:  Blood pressure (!) 154/71, pulse 73, temperature (!) 97.5 F (36.4 C), temperature source Oral, resp. rate (!) 22, height 5\' 8"  (1.727 m), weight 86.2 kg (190 lb), SpO2 99 %.  PHYSICAL EXAMINATION:  GENERAL:  83 y.o.-year-old patient lying in the bed with no acute distress.  EYES: Pupils equal, round, reactive to light and accommodation. No scleral icterus. Extraocular muscles intact.  HEENT: Head atraumatic, normocephalic. Oropharynx and nasopharynx clear.  NECK:  Supple, no jugular venous distention. No thyroid enlargement, no tenderness.  LUNGS: Normal breath sounds bilaterally, no wheezing, rales,rhonchi or crepitation. No use of accessory muscles of respiration.  CARDIOVASCULAR: S1, S2 normal. No murmurs, rubs, or gallops.  ABDOMEN: Soft, nontender, nondistended. Bowel sounds present. No organomegaly or mass.  EXTREMITIES: No pedal edema, cyanosis, or  clubbing.  NEUROLOGIC: Cranial nerves II through XII are intact. Muscle strength 5/5 in all extremities. Sensation intact. Gait not checked.  PSYCHIATRIC: The patient is alert and oriented x 3.  SKIN: No obvious rash, lesion, or ulcer.   LABORATORY PANEL:   CBC  Recent Labs Lab 06/02/17 1459  WBC 9.7  HGB 11.5*  HCT 35.1  PLT 165   ------------------------------------------------------------------------------------------------------------------  Chemistries   Recent Labs Lab 06/02/17 1459  NA 139  K 4.5  CL 110  CO2 20*  GLUCOSE 214*  BUN 54*  CREATININE 3.02*  CALCIUM 8.6*  MG 1.9  AST 31  ALT 23  ALKPHOS 71  BILITOT 0.7   ------------------------------------------------------------------------------------------------------------------  Cardiac Enzymes No results for input(s): TROPONINI in the last 168 hours. ------------------------------------------------------------------------------------------------------------------  RADIOLOGY:  No results found.  EKG:   Orders placed or performed during the hospital encounter of 04/17/15  . EKG 12-Lead  . EKG 12-Lead  .  EKG    IMPRESSION AND PLAN:  #1. Acute severe diarrhea with incontinence of bowels. Patient requested rectal tube. C. difficile toxin is negative antigen positive and PCR is pending GI panel is negative.[place]  Of severe diarrhea with equivocal results for cdiff,started on vancomycin, 2,acute on chronic renal failure; due to ATN secondary to significant GI losses. Baseline creatinine 1.39, today it is 3.02. Hold diuretics, continue IV fluids and monitor kidney function closely. #3 diabetes mellitus type 2: Patient admitted that she is not taking Lantus anymore. Continue glipizide, regular insulin sliding scale coverage. 4,essential hypertension: Controlled. Continue Norvasc, clonidine, hydralazine.  #5 history of coronary artery disease, PCI before. Continue aspirin, Plavix, statins.  All the  records are reviewed and case discussed with ED provider. Management plans discussed with the patient, family and they are in agreement.  CODE STATUS: full  TOTAL TIME TAKING CARE OF THIS PATIENT:18minutes.    Epifanio Lesches M.D on 06/02/2017 at 5:40 PM  Between 7am to 6pm - Pager - [phone]  After 6pm go to [email or web address] - password [email or web address] [facility]  Office  [phone]  CC: Primary care physician; Marguerita Merles, MD  Note: This dictation was prepared with Dragon dictation along with smaller phrase technology. Any transcriptional errors that result from this process are unintentional.

## 2017-06-02 NOTE — ED Notes (Signed)
Karma Lew [phone].

## 2017-06-03 LAB — BASIC METABOLIC PANEL
ANION GAP: 10 (ref 5–15)
BUN: 62 mg/dL — ABNORMAL HIGH (ref 6–20)
CALCIUM: 8.2 mg/dL — AB (ref 8.9–10.3)
CO2: 17 mmol/L — AB (ref 22–32)
Chloride: 112 mmol/L — ABNORMAL HIGH (ref 101–111)
Creatinine, Ser: 3.25 mg/dL — ABNORMAL HIGH (ref 0.44–1.00)
GFR calc non Af Amer: 11 mL/min — ABNORMAL LOW (ref >60)
GFR, EST AFRICAN AMERICAN: 13 mL/min — AB (ref >60)
GLUCOSE: 246 mg/dL — AB (ref 65–99)
POTASSIUM: 4.4 mmol/L (ref 3.5–5.1)
Sodium: 139 mmol/L (ref 135–145)

## 2017-06-03 LAB — CBC
HEMATOCRIT: 31.4 % — AB (ref 35.0–47.0)
HEMOGLOBIN: 10.2 g/dL — AB (ref 12.0–16.0)
MCH: 27.6 pg (ref 26.0–34.0)
MCHC: 32.4 g/dL (ref 32.0–36.0)
MCV: 85 fL (ref 80.0–100.0)
Platelets: 164 10*3/uL (ref 150–440)
RBC: 3.7 MIL/uL — AB (ref 3.80–5.20)
RDW: 14.2 % (ref 11.5–14.5)
WBC: 18.1 10*3/uL — ABNORMAL HIGH (ref 3.6–11.0)

## 2017-06-03 LAB — GLUCOSE, CAPILLARY
GLUCOSE-CAPILLARY: 217 mg/dL — AB (ref 65–99)
GLUCOSE-CAPILLARY: 307 mg/dL — AB (ref 65–99)
GLUCOSE-CAPILLARY: 255 mg/dL — AB (ref 65–99)
Glucose-Capillary: 258 mg/dL — ABNORMAL HIGH (ref 65–99)

## 2017-06-03 MED ORDER — VANCOMYCIN 50 MG/ML ORAL SOLUTION
500.0000 mg | Freq: Four times a day (QID) | ORAL | Status: DC
  Administered 2017-06-03 – 2017-06-06 (×10): 500 mg via ORAL
  Filled 2017-06-03 (×13): qty 10

## 2017-06-03 MED ORDER — SODIUM CHLORIDE 0.9 % IV BOLUS (SEPSIS)
250.0000 mL | Freq: Once | INTRAVENOUS | Status: AC
  Administered 2017-06-03: 250 mL via INTRAVENOUS

## 2017-06-03 NOTE — Progress Notes (Signed)
Inpatient Diabetes Program Recommendations  AACE/ADA: New Consensus Statement on Inpatient Glycemic Control (2015)  Target Ranges:  Prepandial:   less than 140 mg/dL      Peak postprandial:   less than 180 mg/dL (1-2 hours)      Critically ill patients:  140 - 180 mg/dL   Lab Results  Component Value Date   GLUCAP 217 (H) 06/03/2017    Review of Glycemic Control  Results for SELENI, MELLER (MRN [id number]) as of 06/03/2017 08:27  Ref. Range 06/02/2017 21:02 06/03/2017 07:53  Glucose-Capillary Latest Ref Range: 65 - 99 mg/dL 211 (H) 217 (H)    Diabetes history: Type 2 Outpatient Diabetes medications: Glipizide 10mg  bid, Tradjenta 5mg  qday Current orders for Inpatient glycemic control: Glipizide 2.5mg  bid, Tradjenta 5mg  qday  Inpatient Diabetes Program Recommendations:   Please consider d/c Glipizide while patient is is hospital- given her age and kidney function there is a high risk of hypoglycemia.  Consider Lantus 8 units qday starting now.    Consider adding Novolog 0-5 units qhs  * Please reconsider the use of Glipizide for this patient at discharge.    Gentry Fitz, RN, BA, MHA, CDE Diabetes Coordinator Inpatient Diabetes Program  [phone] (Team Pager) [phone] ([place]) 06/03/2017 8:31 AM

## 2017-06-03 NOTE — Progress Notes (Signed)
Spoke with Dr. Marcille Blanco in regards to pt urine output. Pt bladder scanned and found to have 369. Pt urinated 100 cc to external catheter.  250 mL bolus ordered.

## 2017-06-03 NOTE — Progress Notes (Signed)
[facility] [place] NAME: Sonia Simpson    MR#:  [id number]  DATE OF BIRTH:  [birth year]  SUBJECTIVE:  Presented from home with increasing diarrhea. Patient has rectal tube in. Diarrhea slowing down. Patient tolerating regular diet. She denies any abdominal pain or cramping.  REVIEW OF SYSTEMS:   Review of Systems  Constitutional: Negative for chills, fever and weight loss.  HENT: Negative for ear discharge, ear pain and nosebleeds.   Eyes: Negative for blurred vision, pain and discharge.  Respiratory: Negative for sputum production, shortness of breath, wheezing and stridor.   Cardiovascular: Negative for chest pain, palpitations, orthopnea and PND.  Gastrointestinal: Positive for diarrhea. Negative for abdominal pain, nausea and vomiting.  Genitourinary: Negative for frequency and urgency.  Musculoskeletal: Negative for back pain and joint pain.  Neurological: Positive for weakness. Negative for sensory change, speech change and focal weakness.  Psychiatric/Behavioral: Negative for depression and hallucinations. The patient is not nervous/anxious.    Tolerating Diet:yes Tolerating PT: pending  DRUG ALLERGIES:   Allergies  Allergen Reactions  . Sulfa Antibiotics     VITALS:  Blood pressure 133/69, pulse 70, temperature 98 F (36.7 C), temperature source Other (Comment), resp. rate 18, height 5\' 8"  (1.727 m), weight 75.4 kg (166 lb 4.8 oz), SpO2 100 %.  PHYSICAL EXAMINATION:   Physical Exam  GENERAL:  83 y.o.-year-old patient lying in the bed with no acute distress.  EYES: Pupils equal, round, reactive to light and accommodation. No scleral icterus. Extraocular muscles intact.  HEENT: Head atraumatic, normocephalic. Oropharynx and nasopharynx clear.  NECK:  Supple, no jugular venous distention. No thyroid enlargement, no tenderness.  LUNGS: Normal breath sounds bilaterally, no wheezing, rales, rhonchi. No use of accessory  muscles of respiration.  CARDIOVASCULAR: S1, S2 normal. No murmurs, rubs, or gallops.  ABDOMEN: Soft, nontender, nondistended. Bowel sounds present. No organomegaly or mass. Rectal tube+ EXTREMITIES: No cyanosis, clubbing or edema b/l.    NEUROLOGIC: Cranial nerves II through XII are intact. No focal Motor or sensory deficits b/l.   PSYCHIATRIC:  patient is alert and oriented x 3.  SKIN: No obvious rash, lesion, or ulcer.   LABORATORY PANEL:  CBC  Recent Labs Lab 06/03/17 0317  WBC 18.1*  HGB 10.2*  HCT 31.4*  PLT 164    Chemistries   Recent Labs Lab 06/02/17 1459 06/03/17 0317  NA 139 139  K 4.5 4.4  CL 110 112*  CO2 20* 17*  GLUCOSE 214* 246*  BUN 54* 62*  CREATININE 3.02* 3.25*  CALCIUM 8.6* 8.2*  MG 1.9  --   AST 31  --   ALT 23  --   ALKPHOS 71  --   BILITOT 0.7  --    Cardiac Enzymes No results for input(s): TROPONINI in the last 168 hours. RADIOLOGY:  Ct Abdomen Pelvis Wo Contrast  Result Date: 06/02/2017 CLINICAL DATA:  83 y/o F; stool incontinence and abdominal cramping. EXAM: CT ABDOMEN AND PELVIS WITHOUT CONTRAST TECHNIQUE: Multidetector CT imaging of the abdomen and pelvis was performed following the standard protocol without IV contrast. COMPARISON:  04/17/2015 CT abdomen and pelvis FINDINGS: Lower chest: Right lower lobe basal posterior segment bronchiectasis and ground-glass opacifications. Severe coronary artery calcification. Hepatobiliary: Punctate nonspecific calcification within the right lobe of liver. No other focal liver lesion. Gallstones. No intra or extrahepatic biliary ductal dilatation. Pancreas: Unremarkable. No pancreatic ductal dilatation or surrounding inflammatory changes. Spleen: Normal in size without focal abnormality. Adrenals/Urinary Tract:  Adrenal glands are unremarkable. Kidneys are normal, without renal calculi, focal lesion, or hydronephrosis. Bladder is unremarkable. Stomach/Bowel: Pan colonic moderate wall thickening and  pericolic edema. Extensive diverticulosis of sigmoid colon. Rectal tube noted. No obstructive changes of small bowel. Diffuse fecalization of small bowel contents. Small hiatal hernia. Vascular/Lymphatic: Aortic atherosclerosis with severe calcification. No enlarged abdominal or pelvic lymph nodes. Reproductive: Myomatous uterus with the largest myoma within the dorsal fundus measuring up to 5.9 cm, densely calcified. Other: No abdominal wall hernia or abnormality. Musculoskeletal: Severe osteoarthrosis of the right hip joint. Mild S-shaped curvature of the thoracolumbar spine. L1 mild anterior compression deformity at the is stable. Stable grade 1 L4-5 anterolisthesis. IMPRESSION: 1. Moderate diffuse pancolitis may be infectious, inflammatory, or ischemic. No evidence for perforation or abscess. Extensive pericolonic edema and small volume of ascites. 2. Fecalization of small bowel contents probably represents chronic dysmotility. 3. Bronchiectasis and ground-glass opacification in right lower lobe may be related to chronic bronchitis, possibly aspiration given distribution. 4. Gallstones. 5. Severe calcific atherosclerosis of aorta. 6. Myomatous uterus. 7. Severe right hip osteoarthrosis. Electronically Signed   By: Kristine Garbe M.D.   On: 06/02/2017 17:50   ASSESSMENT AND PLAN:  Sonia Simpson  is a 83 y.o. female with a known history ofHypertension, diabetes mellitus type 2 presented to ED because of severe diarrhea. Patient has has been having multiple loose stools with incontinence, watery stools with chunks of undigested food. Patient also had abdominal cramps  #1. Acute severe diarrhea with incontinence of bowels.  -Patient has rectal tube. - C. difficile toxin is negative antigen positive and PCR is positive - GI panel is negative -  on oral vancomycin  2. acute on chronic renal failure - due to ATN secondary to significant GI losses - Baseline creatinine 1.39, today it is 3.02. -  Hold diuretics -continue IV fluids and monitor kidney function closely.  #3 diabetes mellitus type 2: - Patient admitted that she is not taking Lantus anymore. - Continue glipizide, regular insulin sliding scale coverage.  4,essential hypertension: Controlled.  -Continue Norvasc, clonidine, hydralazine.   #5 history of coronary artery disease s/p PCI before. - Continue aspirin, Plavix, statins.   PT to see pt  Case discussed with Care Management/Social Worker. Management plans discussed with the patient, family and they are in agreement.  CODE STATUS: FULL  DVT Prophylaxis: lovenox  TOTAL TIME TAKING CARE OF THIS PATIENT: *30* minutes.  >50% time spent on counselling and coordination of care  POSSIBLE D/C IN 1-2 DAYS, DEPENDING ON CLINICAL CONDITION.  Note: This dictation was prepared with Dragon dictation along with smaller phrase technology. Any transcriptional errors that result from this process are unintentional.  Farran Amsden M.D on 06/03/2017 at 11:41 AM  Between 7am to 6pm - Pager - [phone]  After 6pm go to [email or web address] - password [email or web address] [facility]  Office  [phone]  CC: Primary care physician; Marguerita Merles, MD

## 2017-06-03 NOTE — Evaluation (Signed)
Physical Therapy Evaluation Patient Details Name: Sonia Simpson MRN: [id number] DOB: 01/01/1921 Today's Date: 06/03/2017   History of Present Illness  presented to ER and admitted secondary to acute severe diarrhea and incontinence of bowels.  Clinical Impression  Upon evaluation, patient alert and oriented; follows all commands and demonstrates good insight/safety awareness.  Bilat UE/LE grossly symmetrical and WFL for basic transfers and mobility; denies pain at this time.  Completes bed mobility with mod indep; sit/stand, basic transfers and gait (25') with RW, cga/close sup.  Decreased speed of movement (cautious, guarded), but no overt buckling or LOB noted.  Feels mobility is generally close to baseline, just slightly weaker due to acute illness. Would benefit from skilled PT to address above deficits and promote optimal return to PLOF; will maintain on caseload throughout remaining hospitalization to promote strength, endurance gaits; anticipate no formal PT needs upon discharge.    Follow Up Recommendations No PT follow up (will maintain on caseload; anticipate no formal PT needs upon discharge.)    Equipment Recommendations       Recommendations for Other Services       Precautions / Restrictions Precautions Precautions: Fall Precaution Comments: enteric iso Restrictions Weight Bearing Restrictions: No      Mobility  Bed Mobility Overal bed mobility: Modified Independent                Transfers Overall transfer level: Needs assistance Equipment used: Rolling walker (2 wheeled) Transfers: Sit to/from Stand Sit to Stand: Min guard;Supervision            Ambulation/Gait Ambulation/Gait assistance: Min guard;Supervision Ambulation Distance (Feet): 25 Feet Assistive device: Rolling walker (2 wheeled)       General Gait Details: reciprocal stepping pattern with fair step height/length; steady without buckling or LOB.  Negotiates RW without difficulty;  distance limited by fatigue.  Stairs            Wheelchair Mobility    Modified Rankin (Stroke Patients Only)       Balance Overall balance assessment: Needs assistance Sitting-balance support: No upper extremity supported;Feet supported Sitting balance-Leahy Scale: Good     Standing balance support: Bilateral upper extremity supported Standing balance-Leahy Scale: Fair                               Pertinent Vitals/Pain Pain Assessment: No/denies pain    Home Living Family/patient expects to be discharged to:: Private residence Living Arrangements: Alone Available Help at Discharge: Family;Personal care attendant Type of Home: House Home Access: Stairs to enter     Home Layout: Laundry or work area in basement;Two level;Able to live on main level with bedroom/bathroom Home Equipment: [name] - single point;Walker - 4 wheels      Prior Function Level of Independence: Independent with assistive device(s)         Comments: Mod indep with 4WRW for ADLs, household mobility; denies fall history. Has hired caregiver 4 hours/day, 6 days/week with additional check in assist from family.       Hand Dominance        Extremity/Trunk Assessment   Upper Extremity Assessment Upper Extremity Assessment: Overall WFL for tasks assessed    Lower Extremity Assessment Lower Extremity Assessment: Overall WFL for tasks assessed       Communication   Communication: No difficulties  Cognition Arousal/Alertness: Awake/alert Behavior During Therapy: WFL for tasks assessed/performed Overall Cognitive Status: Within Functional Limits for tasks assessed  General Comments      Exercises Other Exercises Other Exercises: Toilet transfer, SPT with RW, cga/close sup; sit/stand from [facility], cga with RW; static standing balance with RW (for hygiene, clothing management), close sup.  Fair/good awareness of limits of  stability and need for assist as appropriate.   Assessment/Plan    PT Assessment Patient needs continued PT services  PT Problem List Decreased strength;Decreased balance;Decreased mobility;Decreased activity tolerance       PT Treatment Interventions DME instruction;Gait training;Stair training;Functional mobility training;Therapeutic activities;Therapeutic exercise;Balance training;Patient/family education    PT Goals (Current goals can be found in the Care Plan section)  Acute Rehab PT Goals Patient Stated Goal: to keep from getting so stiff PT Goal Formulation: With patient Time For Goal Achievement: 06/17/17 Potential to Achieve Goals: Good    Frequency Min 2X/week   Barriers to discharge        Co-evaluation               AM-PAC PT "6 Clicks" Daily Activity  Outcome Measure Difficulty turning over in bed (including adjusting bedclothes, sheets and blankets)?: None Difficulty moving from lying on back to sitting on the side of the bed? : None Difficulty sitting down on and standing up from a chair with arms (e.g., wheelchair, bedside commode, etc,.)?: None Help needed moving to and from a bed to chair (including a wheelchair)?: A Little Help needed walking in hospital room?: A Little Help needed climbing 3-5 steps with a railing? : A Little 6 Click Score: 21    End of Session   Activity Tolerance: Patient tolerated treatment well Patient left: in bed;with call bell/phone within reach;with bed alarm set Nurse Communication: Mobility status PT Visit Diagnosis: Difficulty in walking, not elsewhere classified (R26.2)    Time: 4888-9169 PT Time Calculation (min) (ACUTE ONLY): 28 min   Charges:   PT Evaluation $PT Eval Low Complexity: 1 Low PT Treatments $Therapeutic Activity: 8-22 mins   PT G Codes:   PT G-Codes **NOT FOR INPATIENT CLASS** Functional Assessment Tool Used: AM-PAC 6 Clicks Basic Mobility Functional Limitation: Mobility: Walking and moving  around Mobility: Walking and Moving Around Current Status (I5038): At least 20 percent but less than 40 percent impaired, limited or restricted Mobility: Walking and Moving Around Goal Status [phone]): At least 1 percent but less than 20 percent impaired, limited or restricted    Mak Bonny H. Owens Shark, PT, DPT, NCS 06/03/17, 4:29 PM [phone]

## 2017-06-03 NOTE — Plan of Care (Signed)
Problem: Bowel/Gastric: Goal: Will not experience complications related to bowel motility Outcome: Not Progressing Pt continues to have diarrhea, rectal tube still in place.

## 2017-06-04 LAB — CBC
HEMATOCRIT: 24.1 % — AB (ref 35.0–47.0)
Hemoglobin: 7.9 g/dL — ABNORMAL LOW (ref 12.0–16.0)
MCH: 27.6 pg (ref 26.0–34.0)
MCHC: 32.6 g/dL (ref 32.0–36.0)
MCV: 84.8 fL (ref 80.0–100.0)
Platelets: 126 10*3/uL — ABNORMAL LOW (ref 150–440)
RBC: 2.84 MIL/uL — ABNORMAL LOW (ref 3.80–5.20)
RDW: 14.1 % (ref 11.5–14.5)
WBC: 14.6 10*3/uL — ABNORMAL HIGH (ref 3.6–11.0)

## 2017-06-04 LAB — URINALYSIS, ROUTINE W REFLEX MICROSCOPIC
Bilirubin Urine: NEGATIVE
Glucose, UA: 50 mg/dL — AB
KETONES UR: NEGATIVE mg/dL
Leukocytes, UA: NEGATIVE
Nitrite: NEGATIVE
PH: 5 (ref 5.0–8.0)
Protein, ur: 100 mg/dL — AB
SPECIFIC GRAVITY, URINE: 1.013 (ref 1.005–1.030)

## 2017-06-04 LAB — BASIC METABOLIC PANEL
Anion gap: 8 (ref 5–15)
BUN: 67 mg/dL — AB (ref 6–20)
CHLORIDE: 108 mmol/L (ref 101–111)
CO2: 18 mmol/L — ABNORMAL LOW (ref 22–32)
CREATININE: 3.87 mg/dL — AB (ref 0.44–1.00)
Calcium: 7.5 mg/dL — ABNORMAL LOW (ref 8.9–10.3)
GFR calc Af Amer: 11 mL/min — ABNORMAL LOW (ref >60)
GFR calc non Af Amer: 9 mL/min — ABNORMAL LOW (ref >60)
Glucose, Bld: 248 mg/dL — ABNORMAL HIGH (ref 65–99)
Potassium: 4.5 mmol/L (ref 3.5–5.1)
SODIUM: 134 mmol/L — AB (ref 135–145)

## 2017-06-04 LAB — GLUCOSE, CAPILLARY
GLUCOSE-CAPILLARY: 210 mg/dL — AB (ref 65–99)
GLUCOSE-CAPILLARY: 202 mg/dL — AB (ref 65–99)
GLUCOSE-CAPILLARY: 197 mg/dL — AB (ref 65–99)
Glucose-Capillary: 199 mg/dL — ABNORMAL HIGH (ref 65–99)

## 2017-06-04 LAB — CREATININE, SERUM
Creatinine, Ser: 4.08 mg/dL — ABNORMAL HIGH (ref 0.44–1.00)
GFR calc Af Amer: 10 mL/min — ABNORMAL LOW (ref >60)
GFR, EST NON AFRICAN AMERICAN: 9 mL/min — AB (ref >60)

## 2017-06-04 MED ORDER — INSULIN ASPART 100 UNIT/ML ~~LOC~~ SOLN: 0.0000 [IU] | Freq: Every day | SUBCUTANEOUS | Status: DC

## 2017-06-04 MED ORDER — INSULIN ASPART 100 UNIT/ML ~~LOC~~ SOLN
0.0000 [IU] | Freq: Three times a day (TID) | SUBCUTANEOUS | Status: DC
  Administered 2017-06-04: 2 [IU] via SUBCUTANEOUS
  Administered 2017-06-05 – 2017-06-06 (×2): 3 [IU] via SUBCUTANEOUS
  Administered 2017-06-06: 1 [IU] via SUBCUTANEOUS
  Filled 2017-06-04 (×4): qty 1

## 2017-06-04 NOTE — Care Management Important Message (Signed)
Important Message  Patient Details  Name: Sonia Simpson MRN: [id number] Date of Birth: April 13, 1921   Medicare Important Message Given:  Yes    Marshell Garfinkel, RN 06/04/2017, 9:08 AM

## 2017-06-04 NOTE — Consult Note (Signed)
Date: 06/04/2017                  Patient Name:  Sonia Simpson  MRN: [id number]  DOB: 21-Jul-1921  Age / Sex: 83 y.o., female         PCP: Marguerita Merles, MD                 Service Requesting Consult: IM Hospitalist/ Fritzi Mandes, MD                 Reason for Consult: ARF            History of Present Illness: Patient is a 83 y.o. female with medical problems of Type 2 diabetes, hypertension, who was admitted to [facility] on 06/02/2017 for evaluation of severe diarrhea.  Patient states that diarrhea started primarily on Monday. She had some associated abdominal cramps. Rectal tube was placed. She was diagnosed with C. difficile colitis. Patient also has history of chronic kidney disease In 2016, patient's creatinine was 1.39, GFR 37 Per report, patient creatinine is worsening. In February it was 2.36, in May, 2.82 Admission creatinine 3.02, GFR 14 Patient states that she followed with urologist in [place] Patient lives in [place]   Medications: Outpatient medications: Prescriptions Prior to Admission  Medication Sig Dispense Refill Last Dose  . amLODipine (NORVASC) 10 MG tablet Take 1 tablet by mouth daily.   06/02/2017 at 0800  . aspirin 81 MG tablet Take 81 mg by mouth daily.   06/02/2017 at 0800  . carvedilol (COREG) 25 MG tablet Take 1 tablet by mouth 2 (two) times daily.    06/02/2017 at 0800  . cloNIDine (CATAPRES - DOSED IN MG/24 HR) 0.3 mg/24hr patch Place 1 patch onto the skin once a week.   Past Week at Unknown time  . clopidogrel (PLAVIX) 75 MG tablet Take 75 mg by mouth daily.   06/02/2017 at 0800  . ferrous sulfate 325 (65 FE) MG tablet Take 325 mg by mouth 2 (two) times daily with a meal.   06/02/2017 at 0800  . glipiZIDE (GLUCOTROL) 10 MG tablet Take 1 tablet by mouth 2 (two) times daily.   06/02/2017 at 0800  . hydrALAZINE (APRESOLINE) 50 MG tablet Take 50 mg by mouth 2 (two) times daily.   06/02/2017 at 0800  . linagliptin (TRADJENTA) 5 MG TABS tablet Take 5  mg by mouth daily.   06/02/2017 at 0800  . lisinopril (PRINIVIL,ZESTRIL) 20 MG tablet Take 1 tablet by mouth daily.    06/02/2017 at 0800  . pravastatin (PRAVACHOL) 80 MG tablet Take 80 mg by mouth daily.   06/02/2017 at 0800  . timolol (BETIMOL) 0.5 % ophthalmic solution Place 1 drop into both eyes 2 (two) times daily.   Past Week at Unknown time  . cephALEXin (KEFLEX) 500 MG capsule Take 1 capsule (500 mg total) by mouth 2 (two) times daily. (Patient not taking: Reported on 06/02/2017) 14 capsule 0 Completed Course at Unknown time  . ciprofloxacin (CIPRO) 500 MG tablet Take 1 tablet (500 mg total) by mouth 2 (two) times daily. (Patient not taking: Reported on 06/02/2017) 14 tablet 0 Completed Course at Unknown time  . furosemide (LASIX) 40 MG tablet Take 1.5 tablets by mouth daily.   Not Taking at Unknown time  . hydrALAZINE (APRESOLINE) 25 MG tablet Take 1 tablet (25 mg total) by mouth every 8 (eight) hours. (Patient not taking: Reported on 06/02/2017) 90 tablet 0 Not Taking at Unknown time  .  insulin glargine (LANTUS) 100 UNIT/ML injection Inject 0.08 mLs (8 Units total) into the skin daily. (Patient not taking: Reported on 06/02/2017) 10 mL 11 Not Taking at Unknown time  . metroNIDAZOLE (FLAGYL) 500 MG tablet Take 1 tablet (500 mg total) by mouth 3 (three) times daily. (Patient not taking: Reported on 06/02/2017) 21 tablet 0 Completed Course at Unknown time  . Praziquantel POWD Take 850 mg by mouth once. (Patient not taking: Reported on 06/02/2017) 1 g 0 Not Taking at Unknown time  . ranitidine (ZANTAC) 150 MG tablet Take 150 mg by mouth 2 (two) times daily.   Not Taking at Unknown time  . saccharomyces boulardii (FLORASTOR) 250 MG capsule Take 1 capsule (250 mg total) by mouth 2 (two) times daily. (Patient not taking: Reported on 06/02/2017) 20 capsule 0 Not Taking at Unknown time    Current medications: Current Facility-Administered Medications  Medication Dose Route Frequency Provider Last Rate Last  Dose  . 0.9 %  sodium chloride infusion   Intravenous Once Hinda Kehr, MD 100 mL/hr at 06/02/17 1858    . 0.9 %  sodium chloride infusion   Intravenous Continuous Epifanio Lesches, MD 50 mL/hr at 06/03/17 2054    . acetaminophen (TYLENOL) tablet 650 mg  650 mg Oral Q6H PRN Epifanio Lesches, MD       Or  . acetaminophen (TYLENOL) suppository 650 mg  650 mg Rectal Q6H PRN Epifanio Lesches, MD      . amLODipine (NORVASC) tablet 10 mg  10 mg Oral Daily Epifanio Lesches, MD   Stopped at 06/04/17 [phone]  . aspirin EC tablet 81 mg  81 mg Oral Daily Epifanio Lesches, MD   81 mg at 06/04/17 4854  . carvedilol (COREG) tablet 25 mg  25 mg Oral BID Epifanio Lesches, MD   25 mg at 06/04/17 0919  . cloNIDine (CATAPRES - Dosed in mg/24 hr) patch 0.3 mg  0.3 mg Transdermal Weekly Epifanio Lesches, MD      . clopidogrel (PLAVIX) tablet 75 mg  75 mg Oral Daily Epifanio Lesches, MD   75 mg at 06/04/17 6270  . famotidine (PEPCID) tablet 10 mg  10 mg Oral Daily Epifanio Lesches, MD   10 mg at 06/04/17 3500  . ferrous sulfate tablet 325 mg  325 mg Oral BID WC Epifanio Lesches, MD   325 mg at 06/04/17 0911  . glipiZIDE (GLUCOTROL) tablet 2.5 mg  2.5 mg Oral BID AC Epifanio Lesches, MD   2.5 mg at 06/04/17 0919  . heparin injection 5,000 Units  5,000 Units Subcutaneous Q8H Epifanio Lesches, MD   5,000 Units at 06/04/17 0452  . hydrALAZINE (APRESOLINE) tablet 50 mg  50 mg Oral BID Epifanio Lesches, MD   Stopped at 06/04/17 0919  . linagliptin (TRADJENTA) tablet 5 mg  5 mg Oral Daily Epifanio Lesches, MD   5 mg at 06/03/17 0847  . ondansetron (ZOFRAN) tablet 4 mg  4 mg Oral Q6H PRN Epifanio Lesches, MD       Or  . ondansetron (ZOFRAN) injection 4 mg  4 mg Intravenous Q6H PRN Epifanio Lesches, MD      . pravastatin (PRAVACHOL) tablet 80 mg  80 mg Oral Daily Epifanio Lesches, MD   80 mg at 06/04/17 0911  . saccharomyces boulardii (FLORASTOR) capsule 250  mg  250 mg Oral BID Epifanio Lesches, MD   250 mg at 06/03/17 2051  . timolol (TIMOPTIC) 0.5 % ophthalmic solution 1 drop  1 drop Both Eyes BID Epifanio Lesches,  MD   1 drop at 06/04/17 0920  . traZODone (DESYREL) tablet 25 mg  25 mg Oral QHS PRN Epifanio Lesches, MD      . vancomycin (VANCOCIN) 50 mg/mL oral solution 500 mg  500 mg Oral Q6H Fritzi Mandes, MD   500 mg at 06/04/17 3546      Allergies: Allergies  Allergen Reactions  . Sulfa Antibiotics       Past Medical History: Past Medical History:  Diagnosis Date  . Coronary artery disease   . Diabetes mellitus without complication ([place])   . Glaucoma   . Hypertension   . Renal disorder      Past Surgical History: Past Surgical History:  Procedure Laterality Date  . CORONARY ANGIOPLASTY WITH STENT PLACEMENT     2009     Family History: Family History  Problem Relation Age of Onset  . Diabetes Mellitus II Mother   . Hypertension Father   . Diabetes type II Sister   . Diabetes type II Brother      Social History: Social History   Social History  . Marital status: Widowed    Spouse name: N/A  . Number of children: N/A  . Years of education: N/A   Occupational History  . Not on file.   Social History Main Topics  . Smoking status: Never Smoker  . Smokeless tobacco: Never Used  . Alcohol use No  . Drug use: No  . Sexual activity: Not on file   Other Topics Concern  . Not on file   Social History Narrative  . No narrative on file     Review of Systems: Gen: No fevers or chills HEENT: Denies any vision or hearing problems CV: No chest pain or shortness of breath Resp: No breathing troubles GI: Severe diarrhea. GU : No blood in the urine at present MS: Osteoarthritis Derm:  No complaints Psych: No complaints Heme: No complaints Neuro: No complaints Endocrine. No complaints  Vital Signs: Blood pressure (!) 114/53, pulse 62, temperature 98.2 F (36.8 C), temperature source Oral,  resp. rate 18, height 5\' 8"  (1.727 m), weight 74.6 kg (164 lb 7.4 oz), SpO2 98 %.   Intake/Output Summary (Last 24 hours) at 06/04/17 1338 Last data filed at 06/04/17 1300  Gross per 24 hour  Intake              425 ml  Output              200 ml  Net              225 ml    Weight trends: Filed Weights   06/02/17 1409 06/03/17 0500 06/04/17 0500  Weight: 86.2 kg (190 lb) 75.4 kg (166 lb 4.8 oz) 74.6 kg (164 lb 7.4 oz)    Physical Exam: General:  Elderly, frail, laying in the bed   HEENT Anicteric, moist oral mucous membranes   Neck:  No JVD, supple   Lungs: Normal breathing effort, clear   Heart:: Pulse is regular   Abdomen: Soft, nontender, rectal tube present   Extremities:  No edema   Neurologic: Alert, oriented   Skin: No acute rashes      Foley: External urine collecting system        Lab results: Basic Metabolic Panel:  Recent Labs Lab 06/02/17 1459 06/03/17 0317 06/04/17 0308 06/04/17 1137  NA 139 139 134*  --   K 4.5 4.4 4.5  --   CL 110 112* 108  --  CO2 20* 17* 18*  --   GLUCOSE 214* 246* 248*  --   BUN 54* 62* 67*  --   CREATININE 3.02* 3.25* 3.87* 4.08*  CALCIUM 8.6* 8.2* 7.5*  --   MG 1.9  --   --   --     Liver Function Tests:  Recent Labs Lab 06/02/17 1459  AST 31  ALT 23  ALKPHOS 71  BILITOT 0.7  PROT 6.4*  ALBUMIN 3.5    Recent Labs Lab 06/02/17 1459  LIPASE 38    Recent Labs Lab 06/02/17 1459  AMMONIA 30    CBC:  Recent Labs Lab 06/03/17 0317 06/04/17 1137  WBC 18.1* 14.6*  HGB 10.2* 7.9*  HCT 31.4* 24.1*  MCV 85.0 84.8  PLT 164 126*    Cardiac Enzymes: No results for input(s): CKTOTAL, TROPONINI in the last 168 hours.  BNP: Invalid input(s): POCBNP  CBG:  Recent Labs Lab 06/03/17 1134 06/03/17 1655 06/03/17 2108 06/04/17 0751 06/04/17 1147  GLUCAP 307* 258* 255* 210* 202*    Microbiology: Recent Results (from the past 720 hour(s))  C difficile quick scan w PCR reflex     Status: Abnormal    Collection Time: 06/02/17  2:59 PM  Result Value Ref Range Status   C Diff antigen POSITIVE (A) NEGATIVE Final   C Diff toxin NEGATIVE NEGATIVE Final   C Diff interpretation Results are indeterminate. See PCR results.  Final  Gastrointestinal Panel by PCR , Stool     Status: None   Collection Time: 06/02/17  2:59 PM  Result Value Ref Range Status   Campylobacter species NOT DETECTED NOT DETECTED Final   Plesimonas shigelloides NOT DETECTED NOT DETECTED Final   Salmonella species NOT DETECTED NOT DETECTED Final   Yersinia enterocolitica NOT DETECTED NOT DETECTED Final   Vibrio species NOT DETECTED NOT DETECTED Final   Vibrio cholerae NOT DETECTED NOT DETECTED Final   Enteroaggregative E coli (EAEC) NOT DETECTED NOT DETECTED Final   Enteropathogenic E coli (EPEC) NOT DETECTED NOT DETECTED Final   Enterotoxigenic E coli (ETEC) NOT DETECTED NOT DETECTED Final   Shiga like toxin producing E coli (STEC) NOT DETECTED NOT DETECTED Final   Shigella/Enteroinvasive E coli (EIEC) NOT DETECTED NOT DETECTED Final   Cryptosporidium NOT DETECTED NOT DETECTED Final   Cyclospora cayetanensis NOT DETECTED NOT DETECTED Final   Entamoeba histolytica NOT DETECTED NOT DETECTED Final   Giardia lamblia NOT DETECTED NOT DETECTED Final   Adenovirus F40/41 NOT DETECTED NOT DETECTED Final   Astrovirus NOT DETECTED NOT DETECTED Final   Norovirus GI/GII NOT DETECTED NOT DETECTED Final   Rotavirus A NOT DETECTED NOT DETECTED Final   Sapovirus (I, II, IV, and V) NOT DETECTED NOT DETECTED Final  Clostridium Difficile by PCR     Status: Abnormal   Collection Time: 06/02/17  2:59 PM  Result Value Ref Range Status   Toxigenic C Difficile by pcr POSITIVE (A) NEGATIVE Final    Comment: Positive for toxigenic C. difficile with little to no toxin production. Only treat if clinical presentation suggests symptomatic illness.     Coagulation Studies:  Recent Labs  06/02/17 1657  LABPROT 16.4*  INR 1.31     Urinalysis: No results for input(s): COLORURINE, LABSPEC, PHURINE, GLUCOSEU, HGBUR, BILIRUBINUR, KETONESUR, PROTEINUR, UROBILINOGEN, NITRITE, LEUKOCYTESUR in the last 72 hours.  Invalid input(s): APPERANCEUR      Imaging: Ct Abdomen Pelvis Wo Contrast  Result Date: 06/02/2017 CLINICAL DATA:  83 y/o F; stool incontinence and abdominal  cramping. EXAM: CT ABDOMEN AND PELVIS WITHOUT CONTRAST TECHNIQUE: Multidetector CT imaging of the abdomen and pelvis was performed following the standard protocol without IV contrast. COMPARISON:  04/17/2015 CT abdomen and pelvis FINDINGS: Lower chest: Right lower lobe basal posterior segment bronchiectasis and ground-glass opacifications. Severe coronary artery calcification. Hepatobiliary: Punctate nonspecific calcification within the right lobe of liver. No other focal liver lesion. Gallstones. No intra or extrahepatic biliary ductal dilatation. Pancreas: Unremarkable. No pancreatic ductal dilatation or surrounding inflammatory changes. Spleen: Normal in size without focal abnormality. Adrenals/Urinary Tract: Adrenal glands are unremarkable. Kidneys are normal, without renal calculi, focal lesion, or hydronephrosis. Bladder is unremarkable. Stomach/Bowel: Pan colonic moderate wall thickening and pericolic edema. Extensive diverticulosis of sigmoid colon. Rectal tube noted. No obstructive changes of small bowel. Diffuse fecalization of small bowel contents. Small hiatal hernia. Vascular/Lymphatic: Aortic atherosclerosis with severe calcification. No enlarged abdominal or pelvic lymph nodes. Reproductive: Myomatous uterus with the largest myoma within the dorsal fundus measuring up to 5.9 cm, densely calcified. Other: No abdominal wall hernia or abnormality. Musculoskeletal: Severe osteoarthrosis of the right hip joint. Mild S-shaped curvature of the thoracolumbar spine. L1 mild anterior compression deformity at the is stable. Stable grade 1 L4-5 anterolisthesis.  IMPRESSION: 1. Moderate diffuse pancolitis may be infectious, inflammatory, or ischemic. No evidence for perforation or abscess. Extensive pericolonic edema and small volume of ascites. 2. Fecalization of small bowel contents probably represents chronic dysmotility. 3. Bronchiectasis and ground-glass opacification in right lower lobe may be related to chronic bronchitis, possibly aspiration given distribution. 4. Gallstones. 5. Severe calcific atherosclerosis of aorta. 6. Myomatous uterus. 7. Severe right hip osteoarthrosis. Electronically Signed   By: Kristine Garbe M.D.   On: 06/02/2017 17:50      Assessment & Plan: Pt is a 83 y.o. African-American  female with a PMHX of diabetes type 2, hypertension, chronic kidney disease, was admitted on 06/02/2017 with severe diarrhea and diagnosed with C. difficile colitis.   1. Acute renal failure on chronic kidney disease stage 4 Patient has underlying chronic kidney disease. Her kidney function as outpatient appears to be worsening Per outpatient records, per report, creatinine was 1.8/GFR 32 and August 2017 February 2018, creatinine 2.36; May 2018 creatinine 2.82 Admission creatinine is 3.02/GFR 14 It has progressively worsened this admission and is up to 4.08 today  Differential diagnoses include ATN on chronic kidney disease, versus worsening of chronic interstitial nephritis or diabetic kidney disease - Renal imaging from the recent CT shows diffuse pancolitis but normal kidneys. No obstruction - We will obtain urinalysis - Continue conservative management and maintain hemodynamics. Monitor electrolytes closely  2. Anemia with drop in hemoglobin noted today Consider holding Plavix and aspirin  3. Diabetes type 2 with chronic kidney disease - Blood sugars are consistently above 200. No recent hemoglobin A1c is available  4. C Diff colitis Plan as per hospitalist team

## 2017-06-04 NOTE — Plan of Care (Signed)
Problem: Pain Managment: Goal: General experience of comfort will improve Outcome: Progressing No complaints of pain or discomforts thus far. Kept safe and comfortable.  Problem: Bowel/Gastric: Goal: Will not experience complications related to bowel motility Outcome: Progressing Patient is tested positive for c-diff infection. Currently receiving p.o vancomycin and on enteric precautions. Also with rectal tube in place.

## 2017-06-04 NOTE — Progress Notes (Signed)
Inpatient Diabetes Program Recommendations  AACE/ADA: New Consensus Statement on Inpatient Glycemic Control (2015)  Target Ranges:  Prepandial:   less than 140 mg/dL      Peak postprandial:   less than 180 mg/dL (1-2 hours)      Critically ill patients:  140 - 180 mg/dL   Results for KANDYCE, DIEGUEZ (MRN [id number]) as of 06/04/2017 10:25  Ref. Range 06/03/2017 07:53 06/03/2017 11:34 06/03/2017 16:55 06/03/2017 21:08  Glucose-Capillary Latest Ref Range: 65 - 99 mg/dL 217 (H) 307 (H) 258 (H) 255 (H)   Results for ONIKA, GUDIEL (MRN [id number]) as of 06/04/2017 10:25  Ref. Range 06/04/2017 07:51  Glucose-Capillary Latest Ref Range: 65 - 99 mg/dL 210 (H)    Home DM Meds: Glipizide 10 mg BID       Tradjenta 5 mg daily  Current Insulin Orders: Glipizide 2.5 mg BID      Tradjenta 5 mg daily      MD- Not sure why Novolog SSI discontinued yesterday?  Please consider the following:  1. Stop Glipizide for now (can resume at time of d/c)  2. Start Novolog Sensitive Correction Scale/ SSI (0-9 units) TID AC + HS     --Will follow patient during hospitalization--  Wyn Quaker RN, MSN, CDE Diabetes Coordinator Inpatient Glycemic Control Team Team Pager: [phone] (8a-5p)

## 2017-06-04 NOTE — Care Management (Addendum)
Per MD, there are no anticipated RNCM needs. Paient is on PO Vancomycin. I spoke with patient and she uses [facility]  [phone]. They do not have oral suspension Vanco in stock. They do have 125mg  and 250 mg PO capsules. Per Pharmacist, Vanco suspension can be ordered and available for pick up.  [facility] PCP.  Has a rolling walker that she uses at baseline. PT is not recommending home health PT.   344ml Vanco susp "First Vancomycin" in stock per pharmacy 50mg /mL. Rx per Dr. Fritzi Mandes called to pharmacy [phone]: Vancomycin suspension 125 mg PO every 6 hours for 8 days. No refills. Cost to patient -cash price $104.20. It is not covered under her insurance.  [facility] outpatient pharmacy hours [phone]- message left. I have also contacted [facility] inpatient pharmacy and they can dispense this medication for patient.- they can provide. I have cancelled Rx with [facility]. [facility] inpatient pharmacy to deliver Vanco to unit prior to discharge for patient to take home. Patient notified and understands that it will be supplied to her prior to discharge. RN also notified of this plan.

## 2017-06-04 NOTE — Progress Notes (Signed)
[facility] [place] NAME: Sonia Simpson    MR#:  [id number]  DATE OF BIRTH:  [birth year]  SUBJECTIVE:  Presented from home with increasing diarrhea. Patient has rectal tube in. Diarrhea slowing down. Patient tolerating regular diet. She denies any abdominal pain or cramping. Diarrhea slowed down  REVIEW OF SYSTEMS:   Review of Systems  Constitutional: Negative for chills, fever and weight loss.  HENT: Negative for ear discharge, ear pain and nosebleeds.   Eyes: Negative for blurred vision, pain and discharge.  Respiratory: Negative for sputum production, shortness of breath, wheezing and stridor.   Cardiovascular: Negative for chest pain, palpitations, orthopnea and PND.  Gastrointestinal: Positive for diarrhea. Negative for abdominal pain, nausea and vomiting.  Genitourinary: Negative for frequency and urgency.  Musculoskeletal: Negative for back pain and joint pain.  Neurological: Positive for weakness. Negative for sensory change, speech change and focal weakness.  Psychiatric/Behavioral: Negative for depression and hallucinations. The patient is not nervous/anxious.    Tolerating Diet:yes Tolerating PT: no PT recommendations   DRUG ALLERGIES:   Allergies  Allergen Reactions  . Sulfa Antibiotics     VITALS:  Blood pressure (!) 114/53, pulse 62, temperature 98.2 F (36.8 C), temperature source Oral, resp. rate 18, height 5\' 8"  (1.727 m), weight 74.6 kg (164 lb 7.4 oz), SpO2 98 %.  PHYSICAL EXAMINATION:   Physical Exam  GENERAL:  83 y.o.-year-old patient lying in the bed with no acute distress.  EYES: Pupils equal, round, reactive to light and accommodation. No scleral icterus. Extraocular muscles intact.  HEENT: Head atraumatic, normocephalic. Oropharynx and nasopharynx clear.  NECK:  Supple, no jugular venous distention. No thyroid enlargement, no tenderness.  LUNGS: Normal breath sounds bilaterally, no wheezing,  rales, rhonchi. No use of accessory muscles of respiration.  CARDIOVASCULAR: S1, S2 normal. No murmurs, rubs, or gallops.  ABDOMEN: Soft, nontender, nondistended. Bowel sounds present. No organomegaly or mass. EXTREMITIES: No cyanosis, clubbing or edema b/l.    NEUROLOGIC: Cranial nerves II through XII are intact. No focal Motor or sensory deficits b/l.   PSYCHIATRIC:  patient is alert and oriented x 3.  SKIN: No obvious rash, lesion, or ulcer.   LABORATORY PANEL:  CBC  Recent Labs Lab 06/04/17 1137  WBC 14.6*  HGB 7.9*  HCT 24.1*  PLT 126*    Chemistries   Recent Labs Lab 06/02/17 1459  06/04/17 0308 06/04/17 1137  NA 139  < > 134*  --   K 4.5  < > 4.5  --   CL 110  < > 108  --   CO2 20*  < > 18*  --   GLUCOSE 214*  < > 248*  --   BUN 54*  < > 67*  --   CREATININE 3.02*  < > 3.87* 4.08*  CALCIUM 8.6*  < > 7.5*  --   MG 1.9  --   --   --   AST 31  --   --   --   ALT 23  --   --   --   ALKPHOS 71  --   --   --   BILITOT 0.7  --   --   --   < > = values in this interval not displayed. Cardiac Enzymes No results for input(s): TROPONINI in the last 168 hours. RADIOLOGY:  Ct Abdomen Pelvis Wo Contrast  Result Date: 06/02/2017 CLINICAL DATA:  83 y/o F; stool incontinence and abdominal cramping.  EXAM: CT ABDOMEN AND PELVIS WITHOUT CONTRAST TECHNIQUE: Multidetector CT imaging of the abdomen and pelvis was performed following the standard protocol without IV contrast. COMPARISON:  04/17/2015 CT abdomen and pelvis FINDINGS: Lower chest: Right lower lobe basal posterior segment bronchiectasis and ground-glass opacifications. Severe coronary artery calcification. Hepatobiliary: Punctate nonspecific calcification within the right lobe of liver. No other focal liver lesion. Gallstones. No intra or extrahepatic biliary ductal dilatation. Pancreas: Unremarkable. No pancreatic ductal dilatation or surrounding inflammatory changes. Spleen: Normal in size without focal abnormality.  Adrenals/Urinary Tract: Adrenal glands are unremarkable. Kidneys are normal, without renal calculi, focal lesion, or hydronephrosis. Bladder is unremarkable. Stomach/Bowel: Pan colonic moderate wall thickening and pericolic edema. Extensive diverticulosis of sigmoid colon. Rectal tube noted. No obstructive changes of small bowel. Diffuse fecalization of small bowel contents. Small hiatal hernia. Vascular/Lymphatic: Aortic atherosclerosis with severe calcification. No enlarged abdominal or pelvic lymph nodes. Reproductive: Myomatous uterus with the largest myoma within the dorsal fundus measuring up to 5.9 cm, densely calcified. Other: No abdominal wall hernia or abnormality. Musculoskeletal: Severe osteoarthrosis of the right hip joint. Mild S-shaped curvature of the thoracolumbar spine. L1 mild anterior compression deformity at the is stable. Stable grade 1 L4-5 anterolisthesis. IMPRESSION: 1. Moderate diffuse pancolitis may be infectious, inflammatory, or ischemic. No evidence for perforation or abscess. Extensive pericolonic edema and small volume of ascites. 2. Fecalization of small bowel contents probably represents chronic dysmotility. 3. Bronchiectasis and ground-glass opacification in right lower lobe may be related to chronic bronchitis, possibly aspiration given distribution. 4. Gallstones. 5. Severe calcific atherosclerosis of aorta. 6. Myomatous uterus. 7. Severe right hip osteoarthrosis. Electronically Signed   By: Kristine Garbe M.D.   On: 06/02/2017 17:50   ASSESSMENT AND PLAN:  Sonia Simpson  is a 83 y.o. female with a known history ofHypertension, diabetes mellitus type 2 presented to ED because of severe diarrhea. Patient has has been having multiple loose stools with incontinence, watery stools with chunks of undigested food. Patient also had abdominal cramps  #1. Acute severe diarrhea with incontinence of bowels.  -Patient has rectal tube. - C. difficile toxin is negative  antigen positive and PCR is positive - GI panel is negative -  on oral vancomycin  2. acute on chronic renal failure stage IV - due to ATN secondary to significant GI losses - Baseline creatinine 1.39, today it is 3.02. -out patient records show creatinin 2.32 (feb 2018)--2.82 (may 2018)--- 2.98 (july 2018)--- admitted with 3.08--3.87--4.08 - Hold diuretics -continue IV fluids and monitor kidney function closely. -pt [facility] [place] [facility] [place] Dr Ermalinda Barrios [phone] -Seen here by Dr Candiss Norse  #3 diabetes mellitus type 2: - Patient admitted that she is not taking Lantus anymore. - d/c glipizide, regular insulin sliding scale coverage. -cont Tradjenta  4,essential hypertension: Controlled.  -Continue Norvasc, clonidine, hydralazine.   #5 history of coronary artery disease s/p PCI before. - Continue aspirin, Plavix, statins.   PT to see pt--recommends no PT needs  Updated dter Demetrius Revel on the phone  Case discussed with Care Management/Social Worker. Management plans discussed with the patient, family and they are in agreement.  CODE STATUS: FULL  DVT Prophylaxis: lovenox  TOTAL TIME TAKING CARE OF THIS PATIENT: *30* minutes.  >50% time spent on counselling and coordination of care  POSSIBLE D/C IN 1-2 DAYS, DEPENDING ON CLINICAL CONDITION.  Note: This dictation was prepared with Dragon dictation along with smaller phrase technology. Any transcriptional errors that result from this process are unintentional.  Malike Foglio M.D on  06/04/2017 at 4:06 PM  Between 7am to 6pm - Pager - [phone]  After 6pm go to [email or web address] - password [email or web address] [facility]  Office  [phone]  CC: Primary care physician; Marguerita Merles, MD

## 2017-06-05 LAB — IRON AND TIBC
Iron: 33 ug/dL (ref 28–170)
SATURATION RATIOS: 17 % (ref 10.4–31.8)
TIBC: 195 ug/dL — AB (ref 250–450)
UIBC: 162 ug/dL

## 2017-06-05 LAB — HEMOGLOBIN
HEMOGLOBIN: 7.7 g/dL — AB (ref 12.0–16.0)
HEMOGLOBIN: 8.5 g/dL — AB (ref 12.0–16.0)

## 2017-06-05 LAB — BASIC METABOLIC PANEL
ANION GAP: 7 (ref 5–15)
BUN: 70 mg/dL — ABNORMAL HIGH (ref 6–20)
CALCIUM: 7.5 mg/dL — AB (ref 8.9–10.3)
CHLORIDE: 109 mmol/L (ref 101–111)
CO2: 19 mmol/L — AB (ref 22–32)
Creatinine, Ser: 4.22 mg/dL — ABNORMAL HIGH (ref 0.44–1.00)
GFR calc non Af Amer: 8 mL/min — ABNORMAL LOW (ref >60)
GFR, EST AFRICAN AMERICAN: 9 mL/min — AB (ref >60)
GLUCOSE: 169 mg/dL — AB (ref 65–99)
Potassium: 4.4 mmol/L (ref 3.5–5.1)
Sodium: 135 mmol/L (ref 135–145)

## 2017-06-05 LAB — HEMOGLOBIN A1C
HEMOGLOBIN A1C: 7.5 % — AB (ref 4.8–5.6)
Mean Plasma Glucose: 168.55 mg/dL

## 2017-06-05 LAB — GLUCOSE, CAPILLARY
GLUCOSE-CAPILLARY: 232 mg/dL — AB (ref 65–99)
GLUCOSE-CAPILLARY: 127 mg/dL — AB (ref 65–99)
Glucose-Capillary: 177 mg/dL — ABNORMAL HIGH (ref 65–99)
Glucose-Capillary: 188 mg/dL — ABNORMAL HIGH (ref 65–99)

## 2017-06-05 LAB — PREPARE RBC (CROSSMATCH)

## 2017-06-05 LAB — OCCULT BLOOD X 1 CARD TO LAB, STOOL: FECAL OCCULT BLD: POSITIVE — AB

## 2017-06-05 MED ORDER — SODIUM CHLORIDE 0.9 % IV SOLN
Freq: Once | INTRAVENOUS | Status: AC
  Administered 2017-06-05: 15:00:00 via INTRAVENOUS

## 2017-06-05 MED ORDER — PANTOPRAZOLE SODIUM 40 MG PO TBEC
40.0000 mg | DELAYED_RELEASE_TABLET | Freq: Every day | ORAL | Status: DC
Start: 2017-06-05 — End: 2017-06-06
  Administered 2017-06-05 – 2017-06-06 (×2): 40 mg via ORAL
  Filled 2017-06-05 (×2): qty 1

## 2017-06-05 NOTE — Progress Notes (Signed)
[facility] [place] NAME: [name]    MR#:  [id number]  DATE OF BIRTH:  [birth year]  SUBJECTIVE:  Presented from home with increasing diarrhea. Patient has rectal tube in. Diarrhea slowing down. Patient tolerating regular diet. She denies any abdominal pain or cramping. Diarrhea slowed down  REVIEW OF SYSTEMS:   Review of Systems  Constitutional: Negative for chills, fever and weight loss.  HENT: Negative for ear discharge, ear pain and nosebleeds.   Eyes: Negative for blurred vision, pain and discharge.  Respiratory: Negative for sputum production, shortness of breath, wheezing and stridor.   Cardiovascular: Negative for chest pain, palpitations, orthopnea and PND.  Gastrointestinal: Positive for diarrhea. Negative for abdominal pain, nausea and vomiting.  Genitourinary: Negative for frequency and urgency.  Musculoskeletal: Negative for back pain and joint pain.  Neurological: Positive for weakness. Negative for sensory change, speech change and focal weakness.  Psychiatric/Behavioral: Negative for depression and hallucinations. The patient is not nervous/anxious.    Tolerating Diet:yes Tolerating PT: no PT recommendations   DRUG ALLERGIES:   Allergies  Allergen Reactions  . Sulfa Antibiotics     VITALS:  Blood pressure (!) 147/51, pulse 63, temperature 98.5 F (36.9 C), resp. rate 18, height 5\' 8"  (1.727 m), weight 74.6 kg (164 lb 7.4 oz), SpO2 97 %.  PHYSICAL EXAMINATION:   Physical Exam  GENERAL:  83 y.o.-year-old patient lying in the bed with no acute distress. anemic EYES: Pupils equal, round, reactive to light and accommodation. No scleral icterus. Extraocular muscles intact.  HEENT: Head atraumatic, normocephalic. Oropharynx and nasopharynx clear.  NECK:  Supple, no jugular venous distention. No thyroid enlargement, no tenderness.  LUNGS: Normal breath sounds bilaterally, no wheezing, rales, rhonchi. No use  of accessory muscles of respiration.  CARDIOVASCULAR: S1, S2 normal. No murmurs, rubs, or gallops.  ABDOMEN: Soft, nontender, nondistended. Bowel sounds present. No organomegaly or mass. EXTREMITIES: No cyanosis, clubbing or edema b/l.    NEUROLOGIC: Cranial nerves II through XII are intact. No focal Motor or sensory deficits b/l.   PSYCHIATRIC:  patient is alert and oriented x 3.  SKIN: No obvious rash, lesion, or ulcer.   LABORATORY PANEL:  CBC  Recent Labs Lab 06/04/17 1137 06/05/17 0509  WBC 14.6*  --   HGB 7.9* 7.7*  HCT 24.1*  --   PLT 126*  --     Chemistries   Recent Labs Lab 06/02/17 1459  06/05/17 0509  NA 139  < > 135  K 4.5  < > 4.4  CL 110  < > 109  CO2 Sonia*  < > 19*  GLUCOSE 214*  < > 169*  BUN 54*  < > 70*  CREATININE 3.02*  < > 4.22*  CALCIUM 8.6*  < > 7.5*  MG 1.9  --   --   AST 31  --   --   ALT 23  --   --   ALKPHOS 71  --   --   BILITOT 0.7  --   --   < > = values in this interval not displayed. Cardiac Enzymes No results for input(s): TROPONINI in the last 168 hours. RADIOLOGY:  No results found. ASSESSMENT AND PLAN:  Sonia Simpson  is a 83 y.o. female with a known history ofHypertension, diabetes mellitus type 2 presented to ED because of severe diarrhea. Patient has has been having multiple loose stools with incontinence, watery stools with chunks of undigested food. Patient also  had abdominal cramps  #1. Acute severe diarrhea with incontinence of bowels.  -Patient has rectal tube---WBC today - C. difficile toxin is negative antigen positive and PCR is positive - GI panel is negative -  on oral vancomycin  2. acute on chronic renal failure stage IV - due to ATN secondary to significant GI losses - Baseline creatinine 1.39, today it is 3.02. -out patient records show creatinin 2.32 (feb 2018)--2.82 (may 2018)--- 2.98 (july 2018)--- admitted with 3.08--3.87--4.08--4.22 - Hold diuretics -received IV fluids and monitor kidney function  closely. -pt sees [facility] in [place] Dr Ermalinda Barrios [phone] -Seen here by Dr Candiss Norse  #3 diabetes mellitus type 2: - Patient admitted that she is not taking Lantus anymore. - d/c glipizide, regular insulin sliding scale coverage. -cont Tradjenta  4,essential hypertension: Controlled.  -Continue Norvasc, clonidine, hydralazine.   #5 history of coronary artery disease s/p PCI before. - Hold aspirin, Plavix due to anemia -Continue statins.   #6 acute on chronic anemia with heme-positive stools -Hold aspirin and Plavix -Patient came in with hemoglobin of 10.2--- 7.9--- 7.7--- one unit of blood transfusion today----check hemoglobin tomorrow -No active bleeding -Start by mouth Protonix -Consider GI consultation as needed PT to see pt--recommends no PT needs  Updated dter Demetrius Revel on the phone  Case discussed with Care Management/Social Worker. Management plans discussed with the patient, family and they are in agreement.  CODE STATUS: FULL  DVT Prophylaxis: lovenox  TOTAL TIME TAKING CARE OF THIS PATIENT: *30* minutes.  >50% time spent on counselling and coordination of care  POSSIBLE D/C IN 1-2 DAYS, DEPENDING ON CLINICAL CONDITION.  Note: This dictation was prepared with Dragon dictation along with smaller phrase technology. Any transcriptional errors that result from this process are unintentional.  Tierany Appleby M.D on 06/05/2017 at 11:41 AM  Between 7am to 6pm - Pager - [phone]  After 6pm go to [email or web address] - password [email or web address] [facility]  Office  [phone]  CC: Primary care physician; Marguerita Merles, MD

## 2017-06-05 NOTE — Progress Notes (Signed)
[place], [place] 06/05/17  Subjective:   Patient looks and feels better No nausea or vomiting. Able to eat a good breakfast No SOB. No LE edema S Creatinine is worse today  Objective:  Vital signs in last 24 hours:  Temp:  [98.5 F (36.9 C)] 98.5 F (36.9 C) (08/25 0527) Pulse Rate:  [59-63] 63 (08/25 0527) Resp:  [18-20] 18 (08/25 0527) BP: (119-147)/(37-51) 147/51 (08/25 0527) SpO2:  [97 %-99 %] 97 % (08/25 0527)  Weight change:  Filed Weights   06/02/17 1409 06/03/17 0500 06/04/17 0500  Weight: 86.2 kg (190 lb) 75.4 kg (166 lb 4.8 oz) 74.6 kg (164 lb 7.4 oz)    Intake/Output:    Intake/Output Summary (Last 24 hours) at 06/05/17 1153 Last data filed at 06/05/17 0527  Gross per 24 hour  Intake              240 ml  Output              300 ml  Net              -60 ml    Physical Exam: General:  Elderly,well appearing, sitting up in chair  HEENT Anicteric, moist oral mucous membranes   Neck:  No JVD, supple   Lungs: Normal breathing effort, clear   Heart:: Pulse is regular   Abdomen: Soft, nontender,   Extremities:  No edema   Neurologic: Alert, oriented   Skin: No acute rashes      Basic Metabolic Panel:   Recent Labs Lab 06/02/17 1459 06/03/17 0317 06/04/17 0308 06/04/17 1137 06/05/17 0509  NA 139 139 134*  --  135  K 4.5 4.4 4.5  --  4.4  CL 110 112* 108  --  109  CO2 20* 17* 18*  --  19*  GLUCOSE 214* 246* 248*  --  169*  BUN 54* 62* 67*  --  70*  CREATININE 3.02* 3.25* 3.87* 4.08* 4.22*  CALCIUM 8.6* 8.2* 7.5*  --  7.5*  MG 1.9  --   --   --   --      CBC:  Recent Labs Lab 06/02/17 1459 06/03/17 0317 06/04/17 1137 06/05/17 0509  WBC 9.7 18.1* 14.6*  --   HGB 11.5* 10.2* 7.9* 7.7*  HCT 35.1 31.4* 24.1*  --   MCV 85.8 85.0 84.8  --   PLT 165 164 126*  --      No results found for: HEPBSAG, HEPBSAB, HEPBIGM    Microbiology:  Recent Results (from the past 240 hour(s))  C difficile quick scan w  PCR reflex     Status: Abnormal   Collection Time: 06/02/17  2:59 PM  Result Value Ref Range Status   C Diff antigen POSITIVE (A) NEGATIVE Final   C Diff toxin NEGATIVE NEGATIVE Final   C Diff interpretation Results are indeterminate. See PCR results.  Final  Gastrointestinal Panel by PCR , Stool     Status: None   Collection Time: 06/02/17  2:59 PM  Result Value Ref Range Status   Campylobacter species NOT DETECTED NOT DETECTED Final   Plesimonas shigelloides NOT DETECTED NOT DETECTED Final   Salmonella species NOT DETECTED NOT DETECTED Final   Yersinia enterocolitica NOT DETECTED NOT DETECTED Final   Vibrio species NOT DETECTED NOT DETECTED Final   Vibrio cholerae NOT DETECTED NOT DETECTED Final   Enteroaggregative E coli (EAEC) NOT DETECTED NOT DETECTED Final   Enteropathogenic E coli (EPEC) NOT  DETECTED NOT DETECTED Final   Enterotoxigenic E coli (ETEC) NOT DETECTED NOT DETECTED Final   Shiga like toxin producing E coli (STEC) NOT DETECTED NOT DETECTED Final   Shigella/Enteroinvasive E coli (EIEC) NOT DETECTED NOT DETECTED Final   Cryptosporidium NOT DETECTED NOT DETECTED Final   Cyclospora cayetanensis NOT DETECTED NOT DETECTED Final   Entamoeba histolytica NOT DETECTED NOT DETECTED Final   Giardia lamblia NOT DETECTED NOT DETECTED Final   Adenovirus F40/41 NOT DETECTED NOT DETECTED Final   Astrovirus NOT DETECTED NOT DETECTED Final   Norovirus GI/GII NOT DETECTED NOT DETECTED Final   Rotavirus A NOT DETECTED NOT DETECTED Final   Sapovirus (I, II, IV, and V) NOT DETECTED NOT DETECTED Final  Clostridium Difficile by PCR     Status: Abnormal   Collection Time: 06/02/17  2:59 PM  Result Value Ref Range Status   Toxigenic C Difficile by pcr POSITIVE (A) NEGATIVE Final    Comment: Positive for toxigenic C. difficile with little to no toxin production. Only treat if clinical presentation suggests symptomatic illness.    Coagulation Studies:  Recent Labs  06/02/17 1657  LABPROT  16.4*  INR 1.31    Urinalysis:  Recent Labs  06/04/17 2015  COLORURINE YELLOW*  LABSPEC 1.013  PHURINE 5.0  GLUCOSEU 50*  HGBUR SMALL*  BILIRUBINUR NEGATIVE  KETONESUR NEGATIVE  PROTEINUR 100*  NITRITE NEGATIVE  LEUKOCYTESUR NEGATIVE      Imaging: No results found.   Medications:   . sodium chloride     . amLODipine  10 mg Oral Daily  . carvedilol  25 mg Oral BID  . cloNIDine  0.3 mg Transdermal Weekly  . famotidine  10 mg Oral Daily  . ferrous sulfate  325 mg Oral BID WC  . heparin  5,000 Units Subcutaneous Q8H  . hydrALAZINE  50 mg Oral BID  . insulin aspart  0-5 Units Subcutaneous QHS  . insulin aspart  0-9 Units Subcutaneous TID WC  . linagliptin  5 mg Oral Daily  . pantoprazole  40 mg Oral Daily  . pravastatin  80 mg Oral Daily  . saccharomyces boulardii  250 mg Oral BID  . timolol  1 drop Both Eyes BID  . vancomycin  500 mg Oral Q6H   acetaminophen **OR** acetaminophen, ondansetron **OR** ondansetron (ZOFRAN) IV, traZODone  Assessment/ Plan:  83 y.o.African American female with diabetes type 2, hypertension, chronic kidney disease, was admitted on 06/02/2017 with severe diarrhea and diagnosed with C. difficile colitis.   1. Acute renal failure on chronic kidney disease stage 4 with proteinuria Patient has underlying chronic kidney disease. Her kidney function as outpatient appears to be worsening Per outpatient records, per report, creatinine was 1.8/GFR 32 in August 2017 In February 2018, creatinine was 2.36; in May 2018 creatinine 2.82;  She had a outpatient Cr 2.8 few days prior to admission which appears to be baseline Admission creatinine is 3.02/GFR 14 It has progressively worsened this admission and is up to 4.22 today  Differential diagnoses include ATN  with underlying chronic kidney disease from Diabetes or Chronic interstitial nephritis - Renal imaging from the recent CT shows diffuse pancolitis but normal kidneys. No obstruction -  urinalysis shows glucosuria, proteinuria. Rare WBC or RBC - Continue conservative management and maintain hemodynamics. Monitor electrolytes closely - obtain SPEP, UPEP, urine P/C ratio, ANA and ANCA  2. Anemia    Plavix and aspirin are on hold  3. Diabetes type 2 with chronic kidney disease - Blood sugars are  consistently above 200. No recent hemoglobin A1c is available  4. C Diff colitis Plan as per hospitalist team   LOS: 3 Aquiles Ruffini 8/25/201811:53 AM  [facility] [place], [place]

## 2017-06-06 LAB — TYPE AND SCREEN
ABO/RH(D): O POS
Antibody Screen: NEGATIVE
Unit division: 0

## 2017-06-06 LAB — BPAM RBC
Blood Product Expiration Date: 201809242359
ISSUE DATE / TIME: 201808251521
Unit Type and Rh: 5100

## 2017-06-06 LAB — BASIC METABOLIC PANEL WITH GFR
Anion gap: 8 (ref 5–15)
BUN: 73 mg/dL — ABNORMAL HIGH (ref 6–20)
CO2: 18 mmol/L — ABNORMAL LOW (ref 22–32)
Calcium: 8.1 mg/dL — ABNORMAL LOW (ref 8.9–10.3)
Chloride: 112 mmol/L — ABNORMAL HIGH (ref 101–111)
Creatinine, Ser: 4.13 mg/dL — ABNORMAL HIGH (ref 0.44–1.00)
GFR calc Af Amer: 10 mL/min — ABNORMAL LOW
GFR calc non Af Amer: 8 mL/min — ABNORMAL LOW
Glucose, Bld: 222 mg/dL — ABNORMAL HIGH (ref 65–99)
Potassium: 4.7 mmol/L (ref 3.5–5.1)
Sodium: 138 mmol/L (ref 135–145)

## 2017-06-06 LAB — GLUCOSE, CAPILLARY
Glucose-Capillary: 205 mg/dL — ABNORMAL HIGH (ref 65–99)
Glucose-Capillary: 137 mg/dL — ABNORMAL HIGH (ref 65–99)

## 2017-06-06 LAB — HEMOGLOBIN AND HEMATOCRIT, BLOOD
HCT: 31.7 % — ABNORMAL LOW (ref 35.0–47.0)
Hemoglobin: 10.4 g/dL — ABNORMAL LOW (ref 12.0–16.0)

## 2017-06-06 MED ORDER — VANCOMYCIN 50 MG/ML ORAL SOLUTION
125.0000 mg | Freq: Four times a day (QID) | ORAL | Status: DC
  Filled 2017-06-06: qty 70

## 2017-06-06 MED ORDER — VANCOMYCIN 50 MG/ML ORAL SOLUTION: 125.0000 mg | Freq: Four times a day (QID) | ORAL | 0 refills | Status: AC

## 2017-06-06 MED ORDER — VANCOMYCIN 50 MG/ML ORAL SOLUTION
125.0000 mg | Freq: Four times a day (QID) | ORAL | Status: DC
  Administered 2017-06-06: 125 mg via ORAL
  Filled 2017-06-06 (×2): qty 2.5

## 2017-06-06 NOTE — Discharge Instructions (Signed)
Rena diet

## 2017-06-06 NOTE — Progress Notes (Signed)
Sonia Simpson to be D/C'd Home per MD order.  Discussed prescriptions and follow up appointments with the patient. Prescriptions given to patient, medication list explained in detail. Pt verbalized understanding.    Vitals:   06/06/17 0654 06/06/17 0956  BP: (!) 147/42 (!) 162/57  Pulse: 69 67  Resp: 18   Temp: 99.1 F (37.3 C)   SpO2: 95%     Skin clean, dry and intact without evidence of skin break down, no evidence of skin tears noted. IV catheter discontinued intact. Site without signs and symptoms of complications. Dressing and pressure applied. Pt denies pain at this time. No complaints noted.  An After Visit Summary was printed and given to the patient. Patient escorted via [place], and D/C home via private auto.  Francesco Sor

## 2017-06-06 NOTE — Care Management Note (Signed)
Case Management Note  Patient Details  Name: Sonia Simpson MRN: [id number] Date of Birth: 1920/11/04  Subjective/Objective:    The [facility] Pharmacist reports that they will provide the oral solution Vancomycin for [name] after they receive the written prescription. This Probation officer spoke with [name] nurse and requested that he send the prescription for [name] oral suspension vancomycin to the [facility] pharmacy with a note that she is being discharged home today. The [facility] pharmacy will provide the oral suspension Vancomycin for [name] to take home with her today for home use.                 Action/Plan:   Expected Discharge Date:  06/06/17               Expected Discharge Plan:     In-House Referral:     Discharge planning Services     Post Acute Care Choice:    Choice offered to:     DME Arranged:    DME Agency:     HH Arranged:    HH Agency:     Status of Service:     If discussed at [employer] [facility] [employer], dates discussed:    Additional Comments:  Jakarius Flamenco A, RN 06/06/2017, 10:13 AM

## 2017-06-06 NOTE — Discharge Summary (Signed)
Sonia Simpson at [facility] NAME: Sonia Simpson    MR#:  [id number]  DATE OF BIRTH:  Aug 08, 1941  DATE OF ADMISSION:  06/02/2017 ADMITTING PHYSICIAN: Epifanio Lesches, MD  DATE OF DISCHARGE: 06/06/2017  PRIMARY CARE PHYSICIAN: Marguerita Merles, MD    ADMISSION DIAGNOSIS:  C. difficile diarrhea [A04.72] Intractable diarrhea [R19.7] Acute renal failure, unspecified acute renal failure type ([place]) [N17.9]  DISCHARGE DIAGNOSIS:  C difficile Colitis Acute on CKD-IV Acute on chronic anemia  SECONDARY DIAGNOSIS:   Past Medical History:  Diagnosis Date  . Coronary artery disease   . Diabetes mellitus without complication ([place])   . Glaucoma   . Hypertension   . Renal disorder     HOSPITAL COURSE:   DeborahFulleris a 83 y.o.femalewith a known history ofHypertension, diabetes mellitus type 2 presented to ED because of severe diarrhea. Patient hashas been having multiple loose stools with incontinence, watery stools with chunks of undigested food. Patient also had abdominal cramps  #1.Acute severe diarrhea with incontinence of bowels.  - C. difficile toxin is negative antigen positive and PCR is positive - GI panel is negative - on oral vancomycin, diarrhea improving -Spoke with Son (veternarian) and requesting to send stool for tapeworm. Unable to place order but have informed RN to call lab and place order for it if stool sample given before d/c  2. acute on chronic renal failure stage IV -due to ATN secondary to significant GI losses - Baseline creatinine 1.39, today it is 3.02. -out patient records show creatinin 2.32 (feb 2018)--2.82 (may 2018)--- 2.98 (july 2018)--- admitted with 3.08--3.87--4.08--4.22 - Hold diuretics -received IV fluids and monitor kidney function closely. -pt sees [facility] in [place] Dr Ermalinda Barrios [phone] -Seen here by Dr Mindi Curling would like to see Dr Candiss Norse  #3 diabetes  mellitus type 2: - Patient admitted that she is not taking Lantus anymore. - d/c glipizide, regular insulin sliding scale coverage. -cont Tradjenta  4,essential hypertension: Controlled.  -Continue Norvasc, clonidine, hydralazine.   #5 history of coronary artery disease s/p PCI before. - Held aspirin, Plavix due to anemia -Continue statins.   #6 acute on chronic anemia with heme-positive stools -Held aspirin and Plavix---but will resume since no active bleeding -Patient came in with hemoglobin of 10.2--- 7.9--- 7.7--- one unit of blood transfusion--10.1 -No active bleeding -Start by mouth Protonix -Consider GI consultation as needed as out pt PT to see pt--recommends no PT needs  Updated son Sonia Simpson  on the phone.  D/c home later today CONSULTS OBTAINED:  Treatment Team:  Murlean Iba, MD Anthonette Legato, MD Lavonia Dana, MD  DRUG ALLERGIES:   Allergies  Allergen Reactions  . Sulfa Antibiotics     DISCHARGE MEDICATIONS:   Current Discharge Medication List    START taking these medications   Details  vancomycin (VANCOCIN) 50 mg/mL oral solution Take 2.5 mLs (125 mg total) by mouth every 6 (six) hours. Qty: 70 mL, Refills: 0      CONTINUE these medications which have NOT CHANGED   Details  amLODipine (NORVASC) 10 MG tablet Take 1 tablet by mouth daily.    aspirin 81 MG tablet Take 81 mg by mouth daily.    carvedilol (COREG) 25 MG tablet Take 1 tablet by mouth 2 (two) times daily.     cloNIDine (CATAPRES - DOSED IN MG/24 HR) 0.3 mg/24hr patch Place 1 patch onto the skin once a week.    clopidogrel (PLAVIX) 75 MG tablet Take 75  mg by mouth daily.    ferrous sulfate 325 (65 FE) MG tablet Take 325 mg by mouth 2 (two) times daily with a meal.    glipiZIDE (GLUCOTROL) 10 MG tablet Take 1 tablet by mouth 2 (two) times daily.    hydrALAZINE (APRESOLINE) 50 MG tablet Take 50 mg by mouth 2 (two) times daily.    linagliptin (TRADJENTA) 5 MG TABS tablet  Take 5 mg by mouth daily.    pravastatin (PRAVACHOL) 80 MG tablet Take 80 mg by mouth daily.    timolol (BETIMOL) 0.5 % ophthalmic solution Place 1 drop into both eyes 2 (two) times daily.    ranitidine (ZANTAC) 150 MG tablet Take 150 mg by mouth 2 (two) times daily.    saccharomyces boulardii (FLORASTOR) 250 MG capsule Take 1 capsule (250 mg total) by mouth 2 (two) times daily. Qty: 20 capsule, Refills: 0      STOP taking these medications     lisinopril (PRINIVIL,ZESTRIL) 20 MG tablet      cephALEXin (KEFLEX) 500 MG capsule      ciprofloxacin (CIPRO) 500 MG tablet      furosemide (LASIX) 40 MG tablet      insulin glargine (LANTUS) 100 UNIT/ML injection      metroNIDAZOLE (FLAGYL) 500 MG tablet      Praziquantel POWD         If you experience worsening of your admission symptoms, develop shortness of breath, life threatening emergency, suicidal or homicidal thoughts you must seek medical attention immediately by calling 911 or calling your MD immediately  if symptoms less severe.  You Must read complete instructions/literature along with all the possible adverse reactions/side effects for all the Medicines you take and that have been prescribed to you. Take any new Medicines after you have completely understood and accept all the possible adverse reactions/side effects.   Please note  You were cared for by a hospitalist during your hospital stay. If you have any questions about your discharge medications or the care you received while you were in the hospital after you are discharged, you can call the unit and asked to speak with the hospitalist on call if the hospitalist that took care of you is not available. Once you are discharged, your primary care physician will handle any further medical issues. Please note that NO REFILLS for any discharge medications will be authorized once you are discharged, as it is imperative that you return to your primary care physician (or  establish a relationship with a primary care physician if you do not have one) for your aftercare needs so that they can reassess your need for medications and monitor your lab values. Today   SUBJECTIVE  Doing well. Eating breakfast  VITAL SIGNS:  Blood pressure (!) 147/42, pulse 69, temperature 99.1 F (37.3 C), temperature source Oral, resp. rate 18, height 5\' 8"  (1.727 m), weight 76.5 kg (168 lb 11.2 oz), SpO2 95 %.  I/O:   Intake/Output Summary (Last 24 hours) at 06/06/17 0949 Last data filed at 06/06/17 0933  Gross per 24 hour  Intake              336 ml  Output              800 ml  Net             -464 ml    PHYSICAL EXAMINATION:  GENERAL:  83 y.o.-year-old patient lying in the bed with no acute distress.  EYES: Pupils equal,  round, reactive to light and accommodation. No scleral icterus. Extraocular muscles intact.  HEENT: Head atraumatic, normocephalic. Oropharynx and nasopharynx clear.  NECK:  Supple, no jugular venous distention. No thyroid enlargement, no tenderness.  LUNGS: Normal breath sounds bilaterally, no wheezing, rales,rhonchi or crepitation. No use of accessory muscles of respiration.  CARDIOVASCULAR: S1, S2 normal. No murmurs, rubs, or gallops.  ABDOMEN: Soft, non-tender, non-distended. Bowel sounds present. No organomegaly or mass.  EXTREMITIES: No pedal edema, cyanosis, or clubbing.  NEUROLOGIC: Cranial nerves II through XII are intact. Muscle strength 5/5 in all extremities. Sensation intact. Gait not checked.  PSYCHIATRIC: The patient is alert and oriented x 3.  SKIN: No obvious rash, lesion, or ulcer.   DATA REVIEW:   CBC   Recent Labs Lab 06/04/17 1137  06/06/17 0518  WBC 14.6*  --   --   HGB 7.9*  < > 10.4*  HCT 24.1*  --  31.7*  PLT 126*  --   --   < > = values in this interval not displayed.  Chemistries   Recent Labs Lab 06/02/17 1459  06/06/17 0518  NA 139  < > 138  K 4.5  < > 4.7  CL 110  < > 112*  CO2 20*  < > 18*  GLUCOSE  214*  < > 222*  BUN 54*  < > 73*  CREATININE 3.02*  < > 4.13*  CALCIUM 8.6*  < > 8.1*  MG 1.9  --   --   AST 31  --   --   ALT 23  --   --   ALKPHOS 71  --   --   BILITOT 0.7  --   --   < > = values in this interval not displayed.  Microbiology Results   Recent Results (from the past 240 hour(s))  C difficile quick scan w PCR reflex     Status: Abnormal   Collection Time: 06/02/17  2:59 PM  Result Value Ref Range Status   C Diff antigen POSITIVE (A) NEGATIVE Final   C Diff toxin NEGATIVE NEGATIVE Final   C Diff interpretation Results are indeterminate. See PCR results.  Final  Gastrointestinal Panel by PCR , Stool     Status: None   Collection Time: 06/02/17  2:59 PM  Result Value Ref Range Status   Campylobacter species NOT DETECTED NOT DETECTED Final   Plesimonas shigelloides NOT DETECTED NOT DETECTED Final   Salmonella species NOT DETECTED NOT DETECTED Final   Yersinia enterocolitica NOT DETECTED NOT DETECTED Final   Vibrio species NOT DETECTED NOT DETECTED Final   Vibrio cholerae NOT DETECTED NOT DETECTED Final   Enteroaggregative E coli (EAEC) NOT DETECTED NOT DETECTED Final   Enteropathogenic E coli (EPEC) NOT DETECTED NOT DETECTED Final   Enterotoxigenic E coli (ETEC) NOT DETECTED NOT DETECTED Final   Shiga like toxin producing E coli (STEC) NOT DETECTED NOT DETECTED Final   Shigella/Enteroinvasive E coli (EIEC) NOT DETECTED NOT DETECTED Final   Cryptosporidium NOT DETECTED NOT DETECTED Final   Cyclospora cayetanensis NOT DETECTED NOT DETECTED Final   Entamoeba histolytica NOT DETECTED NOT DETECTED Final   Giardia lamblia NOT DETECTED NOT DETECTED Final   Adenovirus F40/41 NOT DETECTED NOT DETECTED Final   Astrovirus NOT DETECTED NOT DETECTED Final   Norovirus GI/GII NOT DETECTED NOT DETECTED Final   Rotavirus A NOT DETECTED NOT DETECTED Final   Sapovirus (I, II, IV, and V) NOT DETECTED NOT DETECTED Final  Clostridium Difficile by PCR  Status: Abnormal    Collection Time: 06/02/17  2:59 PM  Result Value Ref Range Status   Toxigenic C Difficile by pcr POSITIVE (A) NEGATIVE Final    Comment: Positive for toxigenic C. difficile with little to no toxin production. Only treat if clinical presentation suggests symptomatic illness.    RADIOLOGY:  No results found.   Management plans discussed with the patient, family and they are in agreement.  CODE STATUS:     Code Status Orders        Start     Ordered   06/02/17 1722  Full code  Continuous     06/02/17 1723    Code Status History    Date Active Date Inactive Code Status Order ID Comments User Context   04/18/2015  2:49 AM 04/20/2015  2:28 PM Full Code [id number]  Juluis Mire, MD Inpatient      TOTAL TIME TAKING CARE OF THIS PATIENT: *40* minutes.    Branndon Tuite M.D on 06/06/2017 at 9:49 AM  Between 7am to 6pm - Pager - [phone] After 6pm go to [email or web address] - password [email or web address]  Office  [phone]  CC: Primary care physician; Marguerita Merles, MD

## 2017-06-06 NOTE — Progress Notes (Signed)
Sonia Simpson, [place] 06/06/17  Subjective:   Patient looks and feels better. She is anticipating discharge to home today No nausea or vomiting. Able to eat a good breakfast No SOB. No LE edema S Creatinine is starting to improve today  Objective:  Vital signs in last 24 hours:  Temp:  [97.6 F (36.4 C)-99.1 F (37.3 C)] 99.1 F (37.3 C) (08/26 0654) Pulse Rate:  [57-69] 67 (08/26 0956) Resp:  [16-20] 18 (08/26 0654) BP: (123-172)/(42-64) 162/57 (08/26 0956) SpO2:  [95 %-99 %] 95 % (08/26 0654) Weight:  [76.5 kg (168 lb 11.2 oz)] 76.5 kg (168 lb 11.2 oz) (08/26 0500)  Weight change:  Filed Weights   06/03/17 0500 06/04/17 0500 06/06/17 0500  Weight: 75.4 kg (166 lb 4.8 oz) 74.6 kg (164 lb 7.4 oz) 76.5 kg (168 lb 11.2 oz)    Intake/Output:    Intake/Output Summary (Last 24 hours) at 06/06/17 1116 Last data filed at 06/06/17 0933  Gross per 24 hour  Intake              336 ml  Output              800 ml  Net             -464 ml    Physical Exam: General:  Elderly,well appearing, sitting up in chair  HEENT Anicteric, moist oral mucous membranes   Neck:  No JVD, supple   Lungs: Normal breathing effort, clear   Heart:: Pulse is regular   Abdomen: Soft, nontender,   Extremities:  No edema   Neurologic: Alert, oriented   Skin: No acute rashes      Basic Metabolic Panel:   Recent Labs Lab 06/02/17 1459 06/03/17 0317 06/04/17 0308 06/04/17 1137 06/05/17 0509 06/06/17 0518  NA 139 139 134*  --  135 138  K 4.5 4.4 4.5  --  4.4 4.7  CL 110 112* 108  --  109 112*  CO2 20* 17* 18*  --  19* 18*  GLUCOSE 214* 246* 248*  --  169* 222*  BUN 54* 62* 67*  --  70* 73*  CREATININE 3.02* 3.25* 3.87* 4.08* 4.22* 4.13*  CALCIUM 8.6* 8.2* 7.5*  --  7.5* 8.1*  MG 1.9  --   --   --   --   --      CBC:  Recent Labs Lab 06/02/17 1459 06/03/17 0317 06/04/17 1137 06/05/17 0509 06/05/17 1155 06/06/17 0518  WBC 9.7 18.1* 14.6*  --   --    --   HGB 11.5* 10.2* 7.9* 7.7* 8.5* 10.4*  HCT 35.1 31.4* 24.1*  --   --  31.7*  MCV 85.8 85.0 84.8  --   --   --   PLT 165 164 126*  --   --   --      No results found for: HEPBSAG, HEPBSAB, HEPBIGM    Microbiology:  Recent Results (from the past 240 hour(s))  C difficile quick scan w PCR reflex     Status: Abnormal   Collection Time: 06/02/17  2:59 PM  Result Value Ref Range Status   C Diff antigen POSITIVE (A) NEGATIVE Final   C Diff toxin NEGATIVE NEGATIVE Final   C Diff interpretation Results are indeterminate. See PCR results.  Final  Gastrointestinal Panel by PCR , Stool     Status: None   Collection Time: 06/02/17  2:59 PM  Result Value Ref Range Status  Campylobacter species NOT DETECTED NOT DETECTED Final   Plesimonas shigelloides NOT DETECTED NOT DETECTED Final   Salmonella species NOT DETECTED NOT DETECTED Final   Yersinia enterocolitica NOT DETECTED NOT DETECTED Final   Vibrio species NOT DETECTED NOT DETECTED Final   Vibrio cholerae NOT DETECTED NOT DETECTED Final   Enteroaggregative E coli (EAEC) NOT DETECTED NOT DETECTED Final   Enteropathogenic E coli (EPEC) NOT DETECTED NOT DETECTED Final   Enterotoxigenic E coli (ETEC) NOT DETECTED NOT DETECTED Final   Shiga like toxin producing E coli (STEC) NOT DETECTED NOT DETECTED Final   Shigella/Enteroinvasive E coli (EIEC) NOT DETECTED NOT DETECTED Final   Cryptosporidium NOT DETECTED NOT DETECTED Final   Cyclospora cayetanensis NOT DETECTED NOT DETECTED Final   Entamoeba histolytica NOT DETECTED NOT DETECTED Final   Giardia lamblia NOT DETECTED NOT DETECTED Final   Adenovirus F40/41 NOT DETECTED NOT DETECTED Final   Astrovirus NOT DETECTED NOT DETECTED Final   Norovirus GI/GII NOT DETECTED NOT DETECTED Final   Rotavirus A NOT DETECTED NOT DETECTED Final   Sapovirus (I, II, IV, and V) NOT DETECTED NOT DETECTED Final  Clostridium Difficile by PCR     Status: Abnormal   Collection Time: 06/02/17  2:59 PM  Result  Value Ref Range Status   Toxigenic C Difficile by pcr POSITIVE (A) NEGATIVE Final    Comment: Positive for toxigenic C. difficile with little to no toxin production. Only treat if clinical presentation suggests symptomatic illness.    Coagulation Studies: No results for input(s): LABPROT, INR in the last 72 hours.  Urinalysis:  Recent Labs  06/04/17 2015  COLORURINE YELLOW*  LABSPEC 1.013  PHURINE 5.0  GLUCOSEU 50*  HGBUR SMALL*  BILIRUBINUR NEGATIVE  KETONESUR NEGATIVE  PROTEINUR 100*  NITRITE NEGATIVE  LEUKOCYTESUR NEGATIVE      Imaging: No results found.   Medications:    . amLODipine  10 mg Oral Daily  . carvedilol  25 mg Oral BID  . cloNIDine  0.3 mg Transdermal Weekly  . famotidine  10 mg Oral Daily  . ferrous sulfate  325 mg Oral BID WC  . heparin  5,000 Units Subcutaneous Q8H  . hydrALAZINE  50 mg Oral BID  . insulin aspart  0-5 Units Subcutaneous QHS  . insulin aspart  0-9 Units Subcutaneous TID WC  . linagliptin  5 mg Oral Daily  . pantoprazole  40 mg Oral Daily  . pravastatin  80 mg Oral Daily  . saccharomyces boulardii  250 mg Oral BID  . timolol  1 drop Both Eyes BID  . vancomycin  125 mg Oral Q6H   acetaminophen **OR** acetaminophen, ondansetron **OR** ondansetron (ZOFRAN) IV, traZODone  Assessment/ Plan:  83 y.o.African American female with diabetes type 2, hypertension, chronic kidney disease, was admitted on 06/02/2017 with severe diarrhea and diagnosed with C. difficile colitis.   1. Acute renal failure on chronic kidney disease stage 4 with proteinuria Patient has underlying chronic kidney disease. Her kidney function as outpatient appears to be worsening Per outpatient records, per report, creatinine was 1.8/GFR 32 in August 2017 In February 2018, creatinine was 2.36; in May 2018 creatinine 2.82;  She had a outpatient Cr 2.8 few days prior to admission which appears to be baseline Admission creatinine is 3.02/GFR 14 It has progressively  worsened this admission ; peaked at 4.22;Slightly lower at 4.13 today  Differential diagnoses include ATN  with underlying chronic kidney disease from Diabetes or Chronic interstitial nephritis - Renal imaging from the recent  CT shows diffuse pancolitis but normal kidneys. No obstruction - urinalysis shows glucosuria, proteinuria. Rare WBC or RBC - Continue conservative management and maintain hemodynamics. Monitor electrolytes closely - Serologies are pending. We plan to get outpatient labs on TUE or Wednesday and follow up with her this week.  2. Anemia    Plavix and aspirin are on hold  3. Diabetes type 2 with chronic kidney disease - Blood sugars are consistently above 200. Recent HbA1c 7.5 %  4. C Diff colitis Plan as per hospitalist team   LOS: [place] 8/26/201811:16 [place], [place]

## 2017-06-07 LAB — ANA W/REFLEX IF POSITIVE: ANA: NEGATIVE

## 2017-06-07 NOTE — Care Management (Signed)
Post discharge call from patient's daughter: This daughter is not listed on contact list of patient so information shared was limited. I notified daughter that nephro wants to re-draw her labs "Tuesday or Wednesday".  She wants to call patient's renal doctor to make that appointment. She also had questions about her medications- I referred her to the the discharge summary that should have been sent home with patient. This daughter states she lives in [place] and patient's caregiver had shared that with her.

## 2017-06-08 LAB — PROTEIN ELECTROPHORESIS, SERUM
A/G RATIO SPE: 1.1 (ref 0.7–1.7)
ALPHA-2-GLOBULIN: 0.9 g/dL (ref 0.4–1.0)
Albumin ELP: 2.9 g/dL (ref 2.9–4.4)
Alpha-1-Globulin: 0.4 g/dL (ref 0.0–0.4)
BETA GLOBULIN: 0.7 g/dL (ref 0.7–1.3)
GLOBULIN, TOTAL: 2.6 g/dL (ref 2.2–3.9)
Gamma Globulin: 0.7 g/dL (ref 0.4–1.8)
Total Protein ELP: 5.5 g/dL — ABNORMAL LOW (ref 6.0–8.5)

## 2017-06-08 LAB — ANCA TITERS: C-ANCA: <1:20 {titer}

## 2018-02-11 ENCOUNTER — Ambulatory Visit
Admission: RE | Admit: 2018-02-11 | Discharge: 2018-02-11 | Disposition: A | Discharge status: Home / Self Care | Payer: Medicare Other | Source: Ambulatory Visit | Attending: Nephrology | Admitting: Nephrology

## 2018-02-11 DIAGNOSIS — N185 Chronic kidney disease, stage 5: Secondary | ICD-10-CM | POA: Insufficient documentation

## 2018-02-11 DIAGNOSIS — E1122 Type 2 diabetes mellitus with diabetic chronic kidney disease: Secondary | ICD-10-CM | POA: Insufficient documentation

## 2018-02-11 DIAGNOSIS — D649 Anemia, unspecified: Secondary | ICD-10-CM | POA: Insufficient documentation

## 2018-02-11 LAB — PREPARE RBC (CROSSMATCH)

## 2018-02-11 MED ORDER — SODIUM CHLORIDE FLUSH 0.9 % IV SOLN
INTRAVENOUS | Status: AC
  Filled 2018-02-11: qty 10

## 2018-02-11 MED ORDER — SODIUM CHLORIDE 0.9 % IV SOLN: Freq: Once | INTRAVENOUS | Status: DC

## 2018-02-11 NOTE — OR Nursing (Signed)
Patient's was stable with no signs of adverse reaction to blood transfusion at discharge. After procedure care teaching was reviewed with son and patient. Patient was discharged to home.

## 2018-02-11 NOTE — Discharge Instructions (Signed)

## 2018-02-12 LAB — TYPE AND SCREEN
ABO/RH(D): O POS
ANTIBODY SCREEN: NEGATIVE
UNIT DIVISION: 0

## 2018-02-12 LAB — BPAM RBC
Blood Product Expiration Date: 201905212359
ISSUE DATE / TIME: 201905031141
UNIT TYPE AND RH: 5100

## 2018-07-06 ENCOUNTER — Other Ambulatory Visit: Payer: Self-pay | Admitting: Family Medicine

## 2018-07-06 ENCOUNTER — Ambulatory Visit
Admission: RE | Admit: 2018-07-06 | Discharge: 2018-07-06 | Disposition: A | Discharge status: Home / Self Care | Payer: Medicare Other | Source: Ambulatory Visit | Attending: Family Medicine | Admitting: Family Medicine

## 2018-07-06 DIAGNOSIS — M25551 Pain in right hip: Secondary | ICD-10-CM

## 2019-02-20 ENCOUNTER — Other Ambulatory Visit: Payer: Self-pay

## 2019-02-20 ENCOUNTER — Inpatient Hospital Stay
Admission: EM | Admit: 2019-02-20 | Discharge: 2019-03-13 | DRG: 193 | Disposition: E | Payer: Medicare Other | Attending: Internal Medicine | Admitting: Internal Medicine

## 2019-02-20 ENCOUNTER — Emergency Department: Payer: Medicare Other

## 2019-02-20 DIAGNOSIS — R740 Nonspecific elevation of levels of transaminase and lactic acid dehydrogenase [LDH]: Secondary | ICD-10-CM | POA: Diagnosis present

## 2019-02-20 DIAGNOSIS — Z833 Family history of diabetes mellitus: Secondary | ICD-10-CM | POA: Diagnosis not present

## 2019-02-20 DIAGNOSIS — J189 Pneumonia, unspecified organism: Secondary | ICD-10-CM | POA: Diagnosis not present

## 2019-02-20 DIAGNOSIS — Z20828 Contact with and (suspected) exposure to other viral communicable diseases: Secondary | ICD-10-CM | POA: Diagnosis present

## 2019-02-20 DIAGNOSIS — J810 Acute pulmonary edema: Secondary | ICD-10-CM

## 2019-02-20 DIAGNOSIS — E1122 Type 2 diabetes mellitus with diabetic chronic kidney disease: Secondary | ICD-10-CM | POA: Diagnosis present

## 2019-02-20 DIAGNOSIS — I361 Nonrheumatic tricuspid (valve) insufficiency: Secondary | ICD-10-CM | POA: Diagnosis not present

## 2019-02-20 DIAGNOSIS — Z7902 Long term (current) use of antithrombotics/antiplatelets: Secondary | ICD-10-CM | POA: Diagnosis not present

## 2019-02-20 DIAGNOSIS — R54 Age-related physical debility: Secondary | ICD-10-CM

## 2019-02-20 DIAGNOSIS — Z7982 Long term (current) use of aspirin: Secondary | ICD-10-CM | POA: Diagnosis not present

## 2019-02-20 DIAGNOSIS — Z8249 Family history of ischemic heart disease and other diseases of the circulatory system: Secondary | ICD-10-CM | POA: Diagnosis not present

## 2019-02-20 DIAGNOSIS — I5033 Acute on chronic diastolic (congestive) heart failure: Secondary | ICD-10-CM | POA: Diagnosis present

## 2019-02-20 DIAGNOSIS — J9601 Acute respiratory failure with hypoxia: Secondary | ICD-10-CM | POA: Diagnosis present

## 2019-02-20 DIAGNOSIS — I132 Hypertensive heart and chronic kidney disease with heart failure and with stage 5 chronic kidney disease, or end stage renal disease: Secondary | ICD-10-CM | POA: Diagnosis present

## 2019-02-20 DIAGNOSIS — T68XXXA Hypothermia, initial encounter: Secondary | ICD-10-CM | POA: Diagnosis present

## 2019-02-20 DIAGNOSIS — Z79899 Other long term (current) drug therapy: Secondary | ICD-10-CM | POA: Diagnosis not present

## 2019-02-20 DIAGNOSIS — Z66 Do not resuscitate: Secondary | ICD-10-CM | POA: Diagnosis present

## 2019-02-20 DIAGNOSIS — N185 Chronic kidney disease, stage 5: Secondary | ICD-10-CM | POA: Diagnosis not present

## 2019-02-20 DIAGNOSIS — Z882 Allergy status to sulfonamides status: Secondary | ICD-10-CM | POA: Diagnosis not present

## 2019-02-20 DIAGNOSIS — Y95 Nosocomial condition: Secondary | ICD-10-CM | POA: Diagnosis present

## 2019-02-20 DIAGNOSIS — Z955 Presence of coronary angioplasty implant and graft: Secondary | ICD-10-CM

## 2019-02-20 DIAGNOSIS — I2510 Atherosclerotic heart disease of native coronary artery without angina pectoris: Secondary | ICD-10-CM | POA: Diagnosis present

## 2019-02-20 DIAGNOSIS — N390 Urinary tract infection, site not specified: Secondary | ICD-10-CM | POA: Diagnosis present

## 2019-02-20 DIAGNOSIS — Z7984 Long term (current) use of oral hypoglycemic drugs: Secondary | ICD-10-CM | POA: Diagnosis not present

## 2019-02-20 DIAGNOSIS — N179 Acute kidney failure, unspecified: Secondary | ICD-10-CM

## 2019-02-20 DIAGNOSIS — N186 End stage renal disease: Secondary | ICD-10-CM | POA: Diagnosis present

## 2019-02-20 DIAGNOSIS — R7989 Other specified abnormal findings of blood chemistry: Secondary | ICD-10-CM

## 2019-02-20 DIAGNOSIS — R0602 Shortness of breath: Secondary | ICD-10-CM | POA: Diagnosis not present

## 2019-02-20 DIAGNOSIS — J9690 Respiratory failure, unspecified, unspecified whether with hypoxia or hypercapnia: Secondary | ICD-10-CM

## 2019-02-20 DIAGNOSIS — Z7189 Other specified counseling: Secondary | ICD-10-CM

## 2019-02-20 DIAGNOSIS — Z515 Encounter for palliative care: Secondary | ICD-10-CM | POA: Diagnosis present

## 2019-02-20 LAB — BLOOD GAS, ARTERIAL
Acid-base deficit: 7.3 mmol/L — ABNORMAL HIGH (ref 0.0–2.0)
Bicarbonate: 17.1 mmol/L — ABNORMAL LOW (ref 20.0–28.0)
FIO2: 0.44
O2 Saturation: 88.7 %
Patient temperature: 37
pCO2 arterial: 31 mmHg — ABNORMAL LOW (ref 32.0–48.0)
pH, Arterial: 7.35 (ref 7.350–7.450)
pO2, Arterial: 59 mmHg — ABNORMAL LOW (ref 83.0–108.0)

## 2019-02-20 LAB — COMPREHENSIVE METABOLIC PANEL
ALT: 107 U/L — ABNORMAL HIGH (ref 0–44)
AST: 197 U/L — ABNORMAL HIGH (ref 15–41)
Albumin: 2.8 g/dL — ABNORMAL LOW (ref 3.5–5.0)
Alkaline Phosphatase: 95 U/L (ref 38–126)
Anion gap: 20 — ABNORMAL HIGH (ref 5–15)
BUN: 130 mg/dL — ABNORMAL HIGH (ref 8–23)
CO2: 17 mmol/L — ABNORMAL LOW (ref 22–32)
Calcium: 7 mg/dL — ABNORMAL LOW (ref 8.9–10.3)
Chloride: 100 mmol/L (ref 98–111)
Creatinine, Ser: 7.58 mg/dL — ABNORMAL HIGH (ref 0.44–1.00)
GFR calc Af Amer: 5 mL/min — ABNORMAL LOW (ref >60)
GFR calc non Af Amer: 4 mL/min — ABNORMAL LOW (ref >60)
Glucose, Bld: 286 mg/dL — ABNORMAL HIGH (ref 70–99)
Potassium: 4.5 mmol/L (ref 3.5–5.1)
Sodium: 137 mmol/L (ref 135–145)
Total Bilirubin: 1.4 mg/dL — ABNORMAL HIGH (ref 0.3–1.2)
Total Protein: 6.2 g/dL — ABNORMAL LOW (ref 6.5–8.1)

## 2019-02-20 LAB — CBC WITH DIFFERENTIAL/PLATELET
Abs Immature Granulocytes: 0.07 10*3/uL (ref 0.00–0.07)
Basophils Absolute: 0 10*3/uL (ref 0.0–0.1)
Basophils Relative: 0 %
Eosinophils Absolute: 0 10*3/uL (ref 0.0–0.5)
Eosinophils Relative: 0 %
HCT: 23.7 % — ABNORMAL LOW (ref 36.0–46.0)
Hemoglobin: 7.9 g/dL — ABNORMAL LOW (ref 12.0–15.0)
Immature Granulocytes: 1 %
Lymphocytes Relative: 1 %
Lymphs Abs: 0.1 10*3/uL — ABNORMAL LOW (ref 0.7–4.0)
MCH: 28.1 pg (ref 26.0–34.0)
MCHC: 33.3 g/dL (ref 30.0–36.0)
MCV: 84.3 fL (ref 80.0–100.0)
Monocytes Absolute: 0.5 10*3/uL (ref 0.1–1.0)
Monocytes Relative: 5 %
Neutro Abs: 10.2 10*3/uL — ABNORMAL HIGH (ref 1.7–7.7)
Neutrophils Relative %: 93 %
Platelets: 175 10*3/uL (ref 150–400)
RBC: 2.81 MIL/uL — ABNORMAL LOW (ref 3.87–5.11)
RDW: 16.1 % — ABNORMAL HIGH (ref 11.5–15.5)
WBC: 10.9 10*3/uL — ABNORMAL HIGH (ref 4.0–10.5)
nRBC: 2.1 % — ABNORMAL HIGH (ref 0.0–0.2)

## 2019-02-20 LAB — URINALYSIS, COMPLETE (UACMP) WITH MICROSCOPIC
Bilirubin Urine: NEGATIVE
Glucose, UA: NEGATIVE mg/dL
Hgb urine dipstick: NEGATIVE
Ketones, ur: NEGATIVE mg/dL
Nitrite: NEGATIVE
Protein, ur: 100 mg/dL — AB
Specific Gravity, Urine: 1.014 (ref 1.005–1.030)
pH: 5 (ref 5.0–8.0)

## 2019-02-20 LAB — PROTIME-INR
INR: 1.3 — ABNORMAL HIGH (ref 0.8–1.2)
Prothrombin Time: 16.2 seconds — ABNORMAL HIGH (ref 11.4–15.2)

## 2019-02-20 LAB — LACTIC ACID, PLASMA: Lactic Acid, Venous: 0.7 mmol/L (ref 0.5–1.9)

## 2019-02-20 LAB — TROPONIN I: Troponin I: <0.03 ng/mL (ref <0.03)

## 2019-02-20 LAB — BRAIN NATRIURETIC PEPTIDE: B Natriuretic Peptide: 172 pg/mL — ABNORMAL HIGH (ref 0.0–100.0)

## 2019-02-20 LAB — SARS CORONAVIRUS 2 BY RT PCR (HOSPITAL ORDER, PERFORMED IN ~~LOC~~ HOSPITAL LAB): SARS Coronavirus 2: NEGATIVE

## 2019-02-20 MED ORDER — LINAGLIPTIN 5 MG PO TABS
5.0000 mg | ORAL_TABLET | Freq: Every day | ORAL | Status: DC
  Filled 2019-02-20 (×2): qty 1

## 2019-02-20 MED ORDER — SODIUM CHLORIDE 0.9 % IV SOLN
1.0000 g | INTRAVENOUS | Status: DC
  Administered 2019-02-21: 20:00:00 1 g via INTRAVENOUS
  Filled 2019-02-20 (×2): qty 1

## 2019-02-20 MED ORDER — IPRATROPIUM-ALBUTEROL 0.5-2.5 (3) MG/3ML IN SOLN
3.0000 mL | Freq: Four times a day (QID) | RESPIRATORY_TRACT | Status: DC
  Administered 2019-02-21 (×2): 3 mL via RESPIRATORY_TRACT
  Filled 2019-02-20 (×2): qty 3

## 2019-02-20 MED ORDER — INSULIN ASPART 100 UNIT/ML ~~LOC~~ SOLN
0.0000 [IU] | Freq: Three times a day (TID) | SUBCUTANEOUS | Status: DC
  Administered 2019-02-21: 09:00:00 3 [IU] via SUBCUTANEOUS
  Administered 2019-02-21: 2 [IU] via SUBCUTANEOUS
  Filled 2019-02-20 (×2): qty 1

## 2019-02-20 MED ORDER — ENOXAPARIN SODIUM 40 MG/0.4ML ~~LOC~~ SOLN: 40.0000 mg | SUBCUTANEOUS | Status: DC

## 2019-02-20 MED ORDER — VITAMIN B-12 1000 MCG PO TABS
1000.0000 ug | ORAL_TABLET | Freq: Every day | ORAL | Status: DC
  Administered 2019-02-21: 1000 ug via ORAL
  Filled 2019-02-20: qty 1

## 2019-02-20 MED ORDER — VANCOMYCIN HCL 500 MG IV SOLR
500.0000 mg | Freq: Once | INTRAVENOUS | Status: AC
  Administered 2019-02-21: 500 mg via INTRAVENOUS
  Filled 2019-02-20: qty 500

## 2019-02-20 MED ORDER — FAMOTIDINE 20 MG PO TABS
20.0000 mg | ORAL_TABLET | Freq: Every day | ORAL | Status: DC
  Administered 2019-02-21: 20 mg via ORAL
  Filled 2019-02-20: qty 1

## 2019-02-20 MED ORDER — IPRATROPIUM-ALBUTEROL 0.5-2.5 (3) MG/3ML IN SOLN
3.0000 mL | Freq: Once | RESPIRATORY_TRACT | Status: AC
  Administered 2019-02-20: 21:00:00 3 mL via RESPIRATORY_TRACT
  Filled 2019-02-20: qty 6

## 2019-02-20 MED ORDER — VANCOMYCIN HCL IN DEXTROSE 1-5 GM/200ML-% IV SOLN
1000.0000 mg | Freq: Once | INTRAVENOUS | Status: AC
  Administered 2019-02-20: 1000 mg via INTRAVENOUS
  Filled 2019-02-20: qty 200

## 2019-02-20 MED ORDER — CARVEDILOL 6.25 MG PO TABS: 12.5000 mg | ORAL_TABLET | Freq: Two times a day (BID) | ORAL | Status: DC

## 2019-02-20 MED ORDER — FERROUS SULFATE 325 (65 FE) MG PO TABS
325.0000 mg | ORAL_TABLET | Freq: Two times a day (BID) | ORAL | Status: DC
  Administered 2019-02-21: 325 mg via ORAL
  Filled 2019-02-20: qty 1

## 2019-02-20 MED ORDER — SODIUM BICARBONATE 650 MG PO TABS
650.0000 mg | ORAL_TABLET | Freq: Two times a day (BID) | ORAL | Status: DC
  Administered 2019-02-21 (×2): 650 mg via ORAL
  Filled 2019-02-20 (×3): qty 1

## 2019-02-20 MED ORDER — AMLODIPINE BESYLATE 5 MG PO TABS: 10.0000 mg | ORAL_TABLET | Freq: Every day | ORAL | Status: DC

## 2019-02-20 MED ORDER — TIMOLOL MALEATE 0.5 % OP SOLN
1.0000 [drp] | Freq: Two times a day (BID) | OPHTHALMIC | Status: DC
  Administered 2019-02-21 (×2): 1 [drp] via OPHTHALMIC
  Filled 2019-02-20: qty 5

## 2019-02-20 MED ORDER — SODIUM CHLORIDE 0.9% FLUSH: 3.0000 mL | Freq: Once | INTRAVENOUS | Status: DC

## 2019-02-20 MED ORDER — VITAMIN D 25 MCG (1000 UNIT) PO TABS
5000.0000 [IU] | ORAL_TABLET | Freq: Every day | ORAL | Status: DC
  Administered 2019-02-21: 12:00:00 5000 [IU] via ORAL
  Filled 2019-02-20: qty 5

## 2019-02-20 MED ORDER — PRAVASTATIN SODIUM 40 MG PO TABS
80.0000 mg | ORAL_TABLET | Freq: Every day | ORAL | Status: DC
  Administered 2019-02-21: 80 mg via ORAL
  Filled 2019-02-20: qty 4
  Filled 2019-02-20 (×2): qty 2

## 2019-02-20 MED ORDER — INSULIN ASPART 100 UNIT/ML ~~LOC~~ SOLN
0.0000 [IU] | Freq: Every day | SUBCUTANEOUS | Status: DC
  Administered 2019-02-21: 01:00:00 3 [IU] via SUBCUTANEOUS
  Filled 2019-02-20: qty 1

## 2019-02-20 MED ORDER — ASPIRIN EC 81 MG PO TBEC
81.0000 mg | DELAYED_RELEASE_TABLET | Freq: Every day | ORAL | Status: DC
  Administered 2019-02-21: 12:00:00 81 mg via ORAL
  Filled 2019-02-20: qty 1

## 2019-02-20 MED ORDER — SODIUM CHLORIDE 0.9 % IV SOLN: 1.0000 g | Freq: Three times a day (TID) | INTRAVENOUS | Status: DC

## 2019-02-20 MED ORDER — SODIUM CHLORIDE 0.9 % IV SOLN
2.0000 g | Freq: Once | INTRAVENOUS | Status: DC
  Administered 2019-02-20: 21:00:00 2 g via INTRAVENOUS
  Filled 2019-02-20: qty 2

## 2019-02-20 MED ORDER — SODIUM CHLORIDE 0.9 % IV BOLUS
500.0000 mL | Freq: Once | INTRAVENOUS | Status: AC
  Administered 2019-02-20: 21:00:00 500 mL via INTRAVENOUS

## 2019-02-20 MED ORDER — HYDRALAZINE HCL 50 MG PO TABS: 100.0000 mg | ORAL_TABLET | Freq: Two times a day (BID) | ORAL | Status: DC

## 2019-02-20 MED ORDER — IPRATROPIUM-ALBUTEROL 0.5-2.5 (3) MG/3ML IN SOLN
3.0000 mL | Freq: Once | RESPIRATORY_TRACT | Status: AC
  Administered 2019-02-20: 21:00:00 3 mL via RESPIRATORY_TRACT

## 2019-02-20 MED ORDER — CLOPIDOGREL BISULFATE 75 MG PO TABS
75.0000 mg | ORAL_TABLET | Freq: Every day | ORAL | Status: DC
  Administered 2019-02-21: 12:00:00 75 mg via ORAL
  Filled 2019-02-20: qty 1

## 2019-02-20 MED ORDER — IPRATROPIUM-ALBUTEROL 0.5-2.5 (3) MG/3ML IN SOLN
3.0000 mL | Freq: Four times a day (QID) | RESPIRATORY_TRACT | Status: DC | PRN
  Administered 2019-02-21: 14:00:00 3 mL via RESPIRATORY_TRACT
  Filled 2019-02-20: qty 3

## 2019-02-20 MED ORDER — SOD CITRATE-CITRIC ACID 500-334 MG/5ML PO SOLN: 30.0000 mL | Freq: Once | ORAL | Status: DC

## 2019-02-20 MED ORDER — GLIPIZIDE 10 MG PO TABS: 10.0000 mg | ORAL_TABLET | Freq: Two times a day (BID) | ORAL | Status: DC

## 2019-02-20 MED ORDER — CALCIUM ACETATE (PHOS BINDER) 667 MG/5ML PO SOLN
667.0000 mg | Freq: Three times a day (TID) | ORAL | Status: DC
  Administered 2019-02-21 (×2): 667 mg via ORAL
  Filled 2019-02-20 (×5): qty 5

## 2019-02-20 NOTE — ED Notes (Signed)
Called and spoke to daughter.

## 2019-02-20 NOTE — ED Notes (Signed)
ED TO INPATIENT HANDOFF REPORT  ED Nurse Name and Phone #: Anson Crofts Name/Age/Gender Sonia Simpson 85 y.o. female Room/Bed: ED06A/ED06A  Code Status   Code Status: Full Code  Home/SNF/Other Home Patient oriented to: self, place, time and situation Is this baseline? Yes   Triage Complete: Triage complete  Chief Complaint resp distress  Triage Note Pt arrives to ED from home via [facility] with c/c of SoB. Pt discharged from [facility] on 1 May after being tx for pneumonia. EMS reports initial O2 sat of 80% which improved to 96% on 4L via [place] and 1 duoneb treatment. Transport vitals reported as 133/69, p51, r20, unable to get temperature. Pt has hx of pueumonia and MRSA. Upon arrival, Pt A&Ox4, NAD, no respiratory distress evident.   Allergies Allergies  Allergen Reactions  . Sulfa Antibiotics     Level of Care/Admitting Diagnosis ED Disposition    ED Disposition Condition Comment   Admit  Hospital Area: [place] [[id number]]  Level of Care: Stepdown [14]  Covid Evaluation: N/A  Diagnosis: Pneumonia [160109]  Admitting Physician: Vaughan Basta [phone]  Attending Physician: Vaughan Basta [phone]  Estimated length of stay: 3 - 4 days  Certification:: I certify this patient will need inpatient services for at least 2 midnights  PT Class (Do Not Modify): Inpatient [101]  PT Acc Code (Do Not Modify): Private [1]       B Medical/Surgery History Past Medical History:  Diagnosis Date  . Coronary artery disease   . Diabetes mellitus without complication ([place])   . Glaucoma   . Hypertension   . Renal disorder    Past Surgical History:  Procedure Laterality Date  . CORONARY ANGIOPLASTY WITH STENT PLACEMENT     2009     A IV Location/Drains/Wounds Patient Lines/Drains/Airways Status   Active Line/Drains/Airways    Name:   Placement date:   Placement time:   Site:   Days:   Peripheral IV 02/25/2019 Left Hand   02/15/2019    1829     Hand   less than 1   Peripheral IV 02/14/2019 Left Forearm   03/01/2019    1858    Forearm   less than 1   PICC Single Lumen 03/05/2019   03/11/2019    2030    -   less than 1          Intake/Output Last 24 hours No intake or output data in the 24 hours ending 02/15/2019 2218  Labs/Imaging Results for orders placed or performed during the hospital encounter of 02/27/2019 (from the past 48 hour(s))  Comprehensive metabolic panel     Status: Abnormal   Collection Time: 02/25/2019  6:25 PM  Result Value Ref Range   Sodium 137 135 - 145 mmol/L   Potassium 4.5 3.5 - 5.1 mmol/L   Chloride 100 98 - 111 mmol/L   CO2 17 (L) 22 - 32 mmol/L   Glucose, Bld 286 (H) 70 - 99 mg/dL   BUN 130 (H) 8 - 23 mg/dL    Comment: RESULTS CONFIRMED BY MANUAL DILUTION   Creatinine, Ser 7.58 (H) 0.44 - 1.00 mg/dL   Calcium 7.0 (L) 8.9 - 10.3 mg/dL   Total Protein 6.2 (L) 6.5 - 8.1 g/dL   Albumin 2.8 (L) 3.5 - 5.0 g/dL   AST 197 (H) 15 - 41 U/L   ALT 107 (H) 0 - 44 U/L   Alkaline Phosphatase 95 38 - 126 U/L   Total Bilirubin  1.4 (H) 0.3 - 1.2 mg/dL   GFR calc non Af Amer 4 (L) >60 mL/min   GFR calc Af Amer 5 (L) >60 mL/min   Anion gap 20 (H) 5 - 15    Comment: Performed at [facility], [address] [place], [place] [address]  Lactic acid, plasma     Status: None   Collection Time: 02/11/2019  6:25 PM  Result Value Ref Range   Lactic Acid, Venous 0.7 0.5 - 1.9 mmol/L    Comment: Performed at [facility], [address] [place], [place] [address]  CBC with Differential     Status: Abnormal   Collection Time: 03/09/2019  6:25 PM  Result Value Ref Range   WBC 10.9 (H) 4.0 - 10.5 K/uL   RBC 2.81 (L) 3.87 - 5.11 MIL/uL   Hemoglobin 7.9 (L) 12.0 - 15.0 g/dL   HCT 23.7 (L) 36.0 - 46.0 %   MCV 84.3 80.0 - 100.0 fL   MCH 28.1 26.0 - 34.0 pg   MCHC 33.3 30.0 - 36.0 g/dL   RDW 16.1 (H) 11.5 - 15.5 %   Platelets 175 150 - 400 K/uL   nRBC 2.1 (H) 0.0 - 0.2 %   Neutrophils Relative % 93 %   Neutro  Abs 10.2 (H) 1.7 - 7.7 K/uL   Lymphocytes Relative 1 %   Lymphs Abs 0.1 (L) 0.7 - 4.0 K/uL   Monocytes Relative 5 %   Monocytes Absolute 0.5 0.1 - 1.0 K/uL   Eosinophils Relative 0 %   Eosinophils Absolute 0.0 0.0 - 0.5 K/uL   Basophils Relative 0 %   Basophils Absolute 0.0 0.0 - 0.1 K/uL   Immature Granulocytes 1 %   Abs Immature Granulocytes 0.07 0.00 - 0.07 K/uL    Comment: Performed at [facility], [address] [place], [place] [address]  Protime-INR     Status: Abnormal   Collection Time: 02/25/2019  6:25 PM  Result Value Ref Range   Prothrombin Time 16.2 (H) 11.4 - 15.2 seconds   INR 1.3 (H) 0.8 - 1.2    Comment: (NOTE) INR goal varies based on device and disease states. Performed at [facility], [address] [place], [place] [address]   Troponin I - Once     Status: None   Collection Time: 02/18/2019  6:25 PM  Result Value Ref Range   Troponin I <0.03 <0.03 ng/mL    Comment: Performed at [facility], [address] [place], [place] [address]  Brain natriuretic peptide     Status: Abnormal   Collection Time: 02/21/2019  6:26 PM  Result Value Ref Range   B Natriuretic Peptide 172.0 (H) 0.0 - 100.0 pg/mL    Comment: Performed at [facility], [address] [place],  [address]  Urinalysis, Complete w Microscopic     Status: Abnormal   Collection Time: 03/07/2019  6:52 PM  Result Value Ref Range   Color, Urine YELLOW (A) YELLOW   APPearance CLOUDY (A) CLEAR   Specific Gravity, Urine 1.014 1.005 - 1.030   pH 5.0 5.0 - 8.0   Glucose, UA NEGATIVE NEGATIVE mg/dL   Hgb urine dipstick NEGATIVE NEGATIVE   Bilirubin Urine NEGATIVE NEGATIVE   Ketones, ur NEGATIVE NEGATIVE mg/dL   Protein, ur 100 (A) NEGATIVE mg/dL   Nitrite NEGATIVE NEGATIVE   Leukocytes,Ua MODERATE (A) NEGATIVE   RBC / HPF 0-5 0 - 5 RBC/hpf   WBC, UA 21-50 0 -  5 WBC/hpf   Bacteria, UA RARE (A) NONE SEEN   Squamous Epithelial / LPF 0-5 0 - 5   WBC Clumps  PRESENT    Mucus PRESENT    Budding Yeast PRESENT     Comment: Performed at [facility], [address] [place], [place] [address]  SARS Coronavirus 2 (CEPHEID- Performed in [place] hospital lab), Hosp Order     Status: None   Collection Time: 03/06/2019  6:52 PM  Result Value Ref Range   SARS Coronavirus 2 NEGATIVE NEGATIVE    Comment: (NOTE) If result is NEGATIVE SARS-CoV-2 target nucleic acids are NOT DETECTED. The SARS-CoV-2 RNA is generally detectable in upper and lower  respiratory specimens during the acute phase of infection. The lowest  concentration of SARS-CoV-2 viral copies this assay can detect is 250  copies / mL. A negative result does not preclude SARS-CoV-2 infection  and should not be used as the sole basis for treatment or other  patient management decisions.  A negative result may occur with  improper specimen collection / handling, submission of specimen other  than nasopharyngeal swab, presence of viral mutation(s) within the  areas targeted by this assay, and inadequate number of viral copies  (<250 copies / mL). A negative result must be combined with clinical  observations, patient history, and epidemiological information. If result is POSITIVE SARS-CoV-2 target nucleic acids are DETECTED. The SARS-CoV-2 RNA is generally detectable in upper and lower  respiratory specimens dur ing the acute phase of infection.  Positive  results are indicative of active infection with SARS-CoV-2.  Clinical  correlation with patient history and other diagnostic information is  necessary to determine patient infection status.  Positive results do  not rule out bacterial infection or co-infection with other viruses. If result is PRESUMPTIVE POSTIVE SARS-CoV-2 nucleic acids MAY BE PRESENT.   A presumptive positive result was obtained on the submitted specimen  and confirmed on repeat testing.  While 2019 novel coronavirus  (SARS-CoV-2) nucleic acids may be present  in the submitted sample  additional confirmatory testing may be necessary for epidemiological  and / or clinical management purposes  to differentiate between  SARS-CoV-2 and other Sarbecovirus currently known to infect humans.  If clinically indicated additional testing with an alternate test  methodology [id number]) is advised. The SARS-CoV-2 RNA is generally  detectable in upper and lower respiratory sp ecimens during the acute  phase of infection. The expected result is Negative. Fact Sheet for Patients:  StrictlyIdeas.no Fact Sheet for Healthcare Providers: BankingDealers.co.za This test is not yet approved or cleared by the [place] FDA and has been authorized for detection and/or diagnosis of SARS-CoV-2 by FDA under an Emergency Use Authorization (EUA).  This EUA will remain in effect (meaning this test can be used) for the duration of the COVID-19 declaration under Section 564(b)(1) of the Act, 21 U.S.C. section 360bbb-3(b)(1), unless the authorization is terminated or revoked sooner. Performed at [facility], [address], [place], [place] [address]    Dg Chest Port 1 View  Result Date: 03/05/2019 CLINICAL DATA:  Shortness of breath. EXAM: PORTABLE CHEST 1 VIEW COMPARISON:  None FINDINGS: Right arm PICC line tip projects over the cavoatrial junction. The heart size appears normal. Lung volumes appear low. Diffuse bilateral interstitial and airspace opacities identified compatible with either multifocal pneumonia, pulmonary edema or ARDS. Visualized osseous structures are unremarkable. IMPRESSION: 1. Diffuse bilateral pulmonary opacities which may represent pneumonia, pulmonary edema, and/or ARDS. Electronically Signed  By: Kerby Moors M.D.   On: 03/10/2019 18:54    Pending Labs Unresulted Labs (From admission, onward)    Start     Ordered   02/27/19 0500  Creatinine, serum  (enoxaparin (LOVENOX)    CrCl >/= 30  ml/min)  Weekly,   STAT    Comments:  while on enoxaparin therapy    03/07/2019 2207   03/04/2019 2211  Blood gas, arterial  ONCE - STAT,   STAT    Question:  Room air or oxygen  Answer:  Oxygen   02/16/2019 2210   03/09/2019 2209  Culture, blood (routine x 2) Call MD if unable to obtain prior to antibiotics being given  BLOOD CULTURE X 2,   STAT    Comments:  If blood cultures drawn in Emergency Department - Do not draw and cancel order    02/21/2019 2209   02/11/2019 2209  Culture, sputum-assessment  Once,   STAT     02/19/2019 2209   03/03/2019 2209  Gram stain  Once,   STAT     03/11/2019 2209   03/12/2019 2209  Influenza panel by PCR (type A & B)  (Influenza PCR Panel)  Once,   STAT     03/12/2019 2209   03/03/2019 2208  Culture, blood (routine x 2) Call MD if unable to obtain prior to antibiotics being given  BLOOD CULTURE X 2,   STAT    Comments:  If blood cultures drawn in Emergency Department - Do not draw and cancel order    02/19/2019 2207   03/10/2019 2208  Culture, sputum-assessment  Once,   STAT     02/24/2019 2207   03/01/2019 2208  Gram stain  Once,   STAT     02/15/2019 2207   02/18/2019 2208  HIV antibody (Routine Screening)  Once,   STAT     02/10/2019 2207   02/10/2019 2208  Strep pneumoniae urinary antigen  Once,   STAT     03/08/2019 2207   02/24/2019 2208  CBC  (enoxaparin (LOVENOX)    CrCl >/= 30 ml/min)  Once,   STAT    Comments:  Baseline for enoxaparin therapy IF NOT ALREADY DRAWN.  Notify MD if PLT < 100 K.    02/13/2019 2207   02/19/2019 2208  Creatinine, serum  (enoxaparin (LOVENOX)    CrCl >/= 30 ml/min)  Once,   STAT    Comments:  Baseline for enoxaparin therapy IF NOT ALREADY DRAWN.    02/19/2019 2207   02/26/2019 2158  Urine Culture  Add-on,   AD     02/12/2019 2157   02/17/2019 2156  Hemoglobin A1c  Once,   STAT    Comments:  To assess prior glycemic control    03/08/2019 2156   02/10/2019 1818  Culture, blood (Routine x 2)  BLOOD CULTURE X 2,   STAT     03/02/2019 1817           Vitals/Pain Today's Vitals   02/19/2019 2113 02/24/2019 2118 02/28/2019 2130 02/18/2019 2155  BP:   (!) 126/47   Pulse: (!) 56 (!) 54 (!) 53   Resp: 19 11 11    Temp:    (!) 90.8 F (32.7 C)  TempSrc:    Rectal  SpO2: 93% 94% 92%   Weight:      Height:      PainSc:        Isolation Precautions Droplet precaution  Medications Medications  ceFEPIme (MAXIPIME) 1 g  in sodium chloride 0.9 % 100 mL IVPB (1 g Intravenous Not Given 02/15/2019 2053)  aspirin EC tablet 81 mg (has no administration in time range)  calcium acetate (Phos Binder) (PHOSLYRA) 667 MG/5ML oral solution 667 mg (has no administration in time range)  Cholecalciferol 5,000 Units (has no administration in time range)  clopidogrel (PLAVIX) tablet 75 mg (has no administration in time range)  ferrous sulfate tablet 325 mg (has no administration in time range)  glipiZIDE (GLUCOTROL) tablet 10 mg (has no administration in time range)  ipratropium-albuterol (DUONEB) 0.5-2.5 (3) MG/3ML nebulizer solution 3 mL (has no administration in time range)  linagliptin (TRADJENTA) tablet 5 mg (has no administration in time range)  pravastatin (PRAVACHOL) tablet 80 mg (has no administration in time range)  famotidine (PEPCID) tablet 20 mg (has no administration in time range)  sodium bicarbonate tablet 650 mg (has no administration in time range)  sodium citrate-citric acid (ORACIT) solution 30 mL (has no administration in time range)  timolol (BETIMOL) 0.5 % ophthalmic solution 1 drop (has no administration in time range)  vitamin B-12 (CYANOCOBALAMIN) tablet 1,000 mcg (has no administration in time range)  insulin aspart (novoLOG) injection 0-15 Units (has no administration in time range)  insulin aspart (novoLOG) injection 0-5 Units (has no administration in time range)  enoxaparin (LOVENOX) injection 40 mg (has no administration in time range)  ipratropium-albuterol (DUONEB) 0.5-2.5 (3) MG/3ML nebulizer solution 3 mL (has no  administration in time range)  vancomycin (VANCOCIN) IVPB 1000 mg/200 mL premix (0 mg Intravenous Stopped 03/05/2019 2156)  sodium chloride 0.9 % bolus 500 mL (500 mLs Intravenous New Bag/Given 02/13/2019 2043)  ipratropium-albuterol (DUONEB) 0.5-2.5 (3) MG/3ML nebulizer solution 3 mL (3 mLs Nebulization Given 03/09/2019 2052)  ipratropium-albuterol (DUONEB) 0.5-2.5 (3) MG/3ML nebulizer solution 3 mL (3 mLs Nebulization Given 02/10/2019 2052)    Mobility non-ambulatory Low fall risk   Focused Assessments Pulmonary Assessment Handoff:  Lung sounds:   O2 Device: Nasal Cannula O2 Flow Rate (L/min): 4 L/min      R Recommendations: See Admitting Provider Note  Report given to:   Additional Notes:  Retail banker in place since 2015

## 2019-02-20 NOTE — ED Notes (Signed)
Bair hugger in place.

## 2019-02-20 NOTE — Progress Notes (Signed)
Family Meeting Note  Advance Directive:yes  Today a meeting took place with the Patient.  The following clinical team members were present during this meeting:MD  The following were discussed:Patient's diagnosis: UTI, multilobar pneumonia, Patient's progosis: Unable to determine and Goals for treatment: Full Code  Additional follow-up to be provided: PMD  Time spent during discussion:20 minutes  Vaughan Basta, MD

## 2019-02-20 NOTE — ED Triage Notes (Signed)
Pt arrives to ED from home via [facility] with c/c of SoB. Pt discharged from [facility] on 1 May after being tx for pneumonia. EMS reports initial O2 sat of 80% which improved to 96% on 4L via [place] and 1 duoneb treatment. Transport vitals reported as 133/69, p51, r20, unable to get temperature. Pt has hx of pueumonia and MRSA. Upon arrival, Pt A&Ox4, NAD, no respiratory distress evident.

## 2019-02-20 NOTE — ED Notes (Signed)
Notified Hospitalist of pts rectal temp and bair hugger set at [employer]

## 2019-02-20 NOTE — ED Provider Notes (Signed)
[facility] Emergency Department Provider Note ____________________________________________   First MD Initiated Contact with Patient 02/14/2019 1827     (approximate)  I have reviewed the triage vital signs and the nursing notes.   HISTORY  Chief Complaint Shortness of Breath    HPI Sonia Simpson is a 85 y.o. female with PMH as noted below who presents with increasing shortness of breath over the last day, gradual onset, and not associated with any pain.  Per EMS, the patient was hospitalized at the beginning this month for pneumonia and is still getting antibiotics.  Initial O2 saturation in the field was 80% which improved to 96% on 4 L nasal cannula after a DuoNeb treatment.  Past Medical History:  Diagnosis Date   Coronary artery disease    Diabetes mellitus without complication ([place])    Glaucoma    Hypertension    Renal disorder     Patient Active Problem List   Diagnosis Date Noted   Clostridium difficile diarrhea 06/02/2017   Bright red rectal bleeding 04/18/2015   Colitis 04/18/2015   UTI (urinary tract infection) 04/18/2015   HTN (hypertension) 04/18/2015   DM (diabetes mellitus) ([place]) 04/18/2015   CAD (coronary artery disease) 04/18/2015    Past Surgical History:  Procedure Laterality Date   CORONARY ANGIOPLASTY WITH STENT PLACEMENT     2009    Prior to Admission medications   Medication Sig Start Date End Date Taking? Authorizing Provider  amLODipine (NORVASC) 10 MG tablet Take 1 tablet by mouth daily. 01/29/14   [provider]  aspirin 81 MG tablet Take 81 mg by mouth daily.    [provider]  calcium acetate, Phos Binder, (PHOSLYRA) 667 MG/5ML SOLN Take 667 mg by mouth 3 (three) times daily with meals.    [provider]  carvedilol (COREG) 25 MG tablet Take 1 tablet by mouth 2 (two) times daily.     [provider]  Cholecalciferol (CVS D3) 5000 units capsule Take 5,000 Units  by mouth daily.    [provider]  cloNIDine (CATAPRES - DOSED IN MG/24 HR) 0.3 mg/24hr patch Place 1 patch onto the skin once a week.    [provider]  clopidogrel (PLAVIX) 75 MG tablet Take 75 mg by mouth daily.    [provider]  ferrous sulfate 325 (65 FE) MG tablet Take 325 mg by mouth 2 (two) times daily with a meal.    [provider]  glipiZIDE (GLUCOTROL) 10 MG tablet Take 1 tablet by mouth 2 (two) times daily.    [provider]  glucosamine-chondroitin 500-400 MG tablet Take 1 tablet by mouth daily.    [provider]  hydrALAZINE (APRESOLINE) 50 MG tablet Take 50 mg by mouth 2 (two) times daily.    [provider]  linagliptin (TRADJENTA) 5 MG TABS tablet Take 5 mg by mouth daily.    [provider]  pravastatin (PRAVACHOL) 80 MG tablet Take 80 mg by mouth daily.    [provider]  ranitidine (ZANTAC) 150 MG tablet Take 150 mg by mouth 2 (two) times daily.    [provider]  saccharomyces boulardii (FLORASTOR) 250 MG capsule Take 1 capsule (250 mg total) by mouth 2 (two) times daily. Patient not taking: Reported on 06/02/2017 04/20/15   Nicholes Mango, MD  sodium citrate-citric acid (ORACIT) 500-334 MG/5ML solution Take 30 mLs by mouth once.    [provider]  timolol (BETIMOL) 0.5 % ophthalmic solution  Place 1 drop into both eyes 2 (two) times daily.    [provider]  vitamin B-12 (CYANOCOBALAMIN) 1000 MCG tablet Take 1,000 mcg by mouth daily.    [provider]    Allergies Sulfa antibiotics  Family History  Problem Relation Age of Onset   Diabetes Mellitus II Mother    Hypertension Father    Diabetes type II Sister    Diabetes type II Brother     Social History Social History   Tobacco Use   Smoking status: Never Smoker   Smokeless tobacco: Never Used  Substance Use Topics   Alcohol use: No   Drug use: No    Review of  Systems  Constitutional: No fever. Eyes: No redness. ENT: No neck pain. Cardiovascular: Denies chest pain. Respiratory: Positive for shortness of breath. Gastrointestinal: No vomiting Genitourinary: Negative for dysuria.  Musculoskeletal: Negative for back pain. Skin: Negative for rash. Neurological: Negative for headache.   ____________________________________________   PHYSICAL EXAM:  VITAL SIGNS: ED Triage Vitals  Enc Vitals Group     BP 02/13/2019 1821 (!) 136/53     Pulse Rate 02/11/2019 1821 (!) 52     Resp 03/02/2019 1821 16     Temp --      Temp src --      SpO2 02/13/2019 1821 93 %     Weight 03/05/2019 1813 157 lb (71.2 kg)     Height 02/19/2019 1813 5\' 9"  (1.753 m)     Head Circumference --      Peak Flow --      Pain Score 03/04/2019 1813 0     Pain Loc --      Pain Edu? --      Excl. in [place]? --     Constitutional: Alert and oriented.  Weak appearing but in no acute distress. Eyes: Conjunctivae are normal.  Head: Atraumatic. Nose: No congestion/rhinnorhea. Mouth/Throat: Mucous membranes are somewhat dry.   Neck: Normal range of motion.  Cardiovascular: Normal rate, regular rhythm. Grossly normal heart sounds.  Good peripheral circulation. Respiratory: Slightly increased respiratory effort.  No retractions.  Diminished breath sounds with some scattered rales bilaterally. Gastrointestinal: Soft and nontender. No distention.  Genitourinary: No flank tenderness. Musculoskeletal: No lower extremity edema.  Extremities warm and well perfused.  Neurologic: Motor intact in all extremities. Skin:  Skin is warm and dry. No rash noted. Psychiatric: Calm and cooperative.  ____________________________________________   LABS (all labs ordered are listed, but only abnormal results are displayed)  Labs Reviewed  COMPREHENSIVE METABOLIC PANEL - Abnormal; Notable for the following components:      Result Value   CO2 17 (*)    Glucose, Bld 286 (*)    BUN 130 (*)     Creatinine, Ser 7.58 (*)    Calcium 7.0 (*)    Total Protein 6.2 (*)    Albumin 2.8 (*)    AST 197 (*)    ALT 107 (*)    Total Bilirubin 1.4 (*)    GFR calc non Af Amer 4 (*)    GFR calc Af Amer 5 (*)    Anion gap 20 (*)    All other components within normal limits  CBC WITH DIFFERENTIAL/PLATELET - Abnormal; Notable for the following components:   WBC 10.9 (*)    RBC 2.81 (*)    Hemoglobin 7.9 (*)    HCT 23.7 (*)    RDW 16.1 (*)    nRBC 2.1 (*)    Neutro  Abs 10.2 (*)    Lymphs Abs 0.1 (*)    All other components within normal limits  PROTIME-INR - Abnormal; Notable for the following components:   Prothrombin Time 16.2 (*)    INR 1.3 (*)    All other components within normal limits  URINALYSIS, COMPLETE (UACMP) WITH MICROSCOPIC - Abnormal; Notable for the following components:   Color, Urine YELLOW (*)    APPearance CLOUDY (*)    Protein, ur 100 (*)    Leukocytes,Ua MODERATE (*)    Bacteria, UA RARE (*)    All other components within normal limits  BRAIN NATRIURETIC PEPTIDE - Abnormal; Notable for the following components:   B Natriuretic Peptide 172.0 (*)    All other components within normal limits  SARS CORONAVIRUS 2 (HOSPITAL ORDER, PERFORMED IN [facility])  CULTURE, BLOOD (ROUTINE X 2)  CULTURE, BLOOD (ROUTINE X 2)  LACTIC ACID, PLASMA  TROPONIN I   ____________________________________________  EKG ED ECG REPORT I, Arta Silence, the attending physician, personally viewed and interpreted this ECG.  Date: 03/11/2019 EKG Time: 1904 Rate: 55 Rhythm: normal sinus rhythm QRS Axis: normal Intervals: LBBB ST/T Wave abnormalities: Nonspecific T wave flattening diffusely Narrative Interpretation: no evidence of acute ischemia  ____________________________________________  RADIOLOGY  CXR: Diffuse bilateral opacities ____________________________________________   PROCEDURES  Procedure(s) performed: No  Procedures  Critical Care  performed: Yes  CRITICAL CARE Performed by: Arta Silence   Total critical care time: 30 minutes  Critical care time was exclusive of separately billable procedures and treating other patients.  Critical care was necessary to treat or prevent imminent or life-threatening deterioration.  Critical care was time spent personally by me on the following activities: development of treatment plan with patient and/or surrogate as well as nursing, discussions with consultants, evaluation of patient's response to treatment, examination of patient, obtaining history from patient or surrogate, ordering and performing treatments and interventions, ordering and review of laboratory studies, ordering and review of radiographic studies, pulse oximetry and re-evaluation of patient's condition. ____________________________________________   INITIAL IMPRESSION / ASSESSMENT AND PLAN / ED COURSE  Pertinent labs & imaging results that were available during my care of the patient were reviewed by me and considered in my medical decision making (see chart for details).  85 year old female with PMH as noted above presents with worsening shortness of breath and hypoxia today.  She is coming from home.  The patient denies any pain at this time.  She has been afebrile.  Per EMS, O2 saturation was in the low 80s on room air.  I reviewed the past medical records in [place].  EMS reported that the patient had been admitted here for pneumonia recently, however I do not see any record of an admission here within the last 2 years.  There are no recent hospital records at other hospitals in [place].  On exam, the patient is weak appearing.  Her vital signs are normal except for O2 saturation in the mid 90s on 10 L by nonrebreather.  However, her respiratory rate is around 15 and she has only minimally increased work of breathing with no acute distress.  Overall presentation is concerning for persistent/recurrent  pneumonia.  Differential includes COVID-19 as well.  I am also considering cardiac etiology or acute bronchitis.  We will obtain chest x-ray, lab work-up, COVID swab, and reassess.  ----------------------------------------- 9:21 PM on 02/16/2019 -----------------------------------------  The patient's respiratory status has improved.  She is now in the mid 90s on 4  L by nasal cannula.  Chest x-ray reveals bilateral multi lobar pneumonia, however the COVID-19 swab is negative.  UA is also consistent with possible UTI.  I covered the patient with broad-spectrum antibiotics.  She was also noted to be significantly hypothermic, so she is currently on a Retail banker and feeling better.  I admitted her to the hospitalist. __________________________  Bobby Rumpf was evaluated in Emergency Department on 02/10/2019 for the symptoms described in the history of present illness. She was evaluated in the context of the global COVID-19 pandemic, which necessitated consideration that the patient might be at risk for infection with the SARS-CoV-2 virus that causes COVID-19. Institutional protocols and algorithms that pertain to the evaluation of patients at risk for COVID-19 are in a state of rapid change based on information released by regulatory bodies including the CDC and federal and state organizations. These policies and algorithms were followed during the patient's care in the ED. ____________________________________________   FINAL CLINICAL IMPRESSION(S) / ED DIAGNOSES  Final diagnoses:  HCAP (healthcare-associated pneumonia)  Acute respiratory failure with hypoxia ([place])  Hypothermia, initial encounter      NEW MEDICATIONS STARTED DURING THIS VISIT:  New Prescriptions   No medications on file     Note:  This document was prepared using Dragon voice recognition software and may include unintentional dictation errors.    Arta Silence, MD 02/21/2019 2122

## 2019-02-20 NOTE — Progress Notes (Signed)
Patient is here with increasing shortness of breath and noted to be hypoxic. She was also somewhat hypothermic but responded well. On work-up in ER she is found to have multilobar pneumonia and UTI. She is started on broad-spectrum antibiotics. Cultures are sent. May need to be admitted to stepdown unit.  I have discussed the case, findings, treatment plan with the nurse practitioner and agree with her suggested plan.

## 2019-02-20 NOTE — ED Notes (Signed)
Unable to give report at this time, ICU will call back when ready

## 2019-02-20 NOTE — Consult Note (Signed)
Name: Sonia Simpson MRN: [id number] DOB: 01-15-1921    ADMISSION DATE:  02/19/2019 CONSULTATION DATE: 03/11/2019  REFERRING MD : Gardiner Barefoot, NP  CHIEF COMPLAINT: Shortness of Breath   BRIEF PATIENT DESCRIPTION:  85 yo female admitted with hypothermia, acute on chronic renal failure, and acute hypoxic respiratory failure secondary to multifocal pneumonia vs. ARDS vs. pulmonary edema   SIGNIFICANT EVENTS/STUDIES:  05/11-Pt admitted to the stepdown unit   HISTORY OF PRESENT ILLNESS:   This is a 85 yo female with a PMH of HTN, Glaucoma, Type II Diabetes Mellitus, CAD, and Renal Disorder.  She presented to [facility] ER via [facility] from home with shortness of breath.  Per ER notes she was recently discharged from [facility] at the beginning of this month following treatment of pneumonia.  During current pt presentation EMS reported upon their arrival pt hypoxic with O2 sats in the 80's on RA, therefore she was placed on 4L via nasal canula and received duoneb treatment O2 sats improved to 96%.  In the ER pt transitioned to 10L HFNC due to hypoxia and pt hypothermic temp 90.1 F via rectum all other vital signs stable, bear hugger applied.  CXR concerning for multifocal pneumonia vs. ARDS vs. Pulmonary edema and UA positive for UTI.  Lab results revealed glucose 286, BUN 130, creatinine 7.58, anion gap 20, troponin <0.03, BNP 172, lactic acid 0.7, wbc 10.9, hgb 7.9, COVID-19 negative, and abg pH 7.35/pCO2 31/pO2 59/acid-base deficit 7.3/bicarb 17.1.  Therefore, pt received duonebs x2, 500 ml NS bolus, and iv vancomycin.  She was subsequently admitted to the stepdown unit by hospitalist team for additional workup and treatment.    PAST MEDICAL HISTORY :   has a past medical history of Coronary artery disease, Diabetes mellitus without complication ([place]), Glaucoma, Hypertension, and Renal disorder.  has a past surgical history that includes Coronary angioplasty with stent. Prior to Admission  medications   Medication Sig Start Date End Date Taking? Authorizing Provider  amLODipine (NORVASC) 10 MG tablet Take 1 tablet by mouth daily. 01/29/14  Yes [provider]  aspirin 81 MG tablet Take 81 mg by mouth daily.   Yes [provider]  calcium acetate, Phos Binder, (PHOSLYRA) 667 MG/5ML SOLN Take 667 mg by mouth 3 (three) times daily with meals.   Yes [provider]  carvedilol (COREG) 25 MG tablet Take 0.5 tablets by mouth 2 (two) times daily.    Yes [provider]  Cholecalciferol (CVS D3) 5000 units capsule Take 5,000 Units by mouth daily.   Yes [provider]  cloNIDine (CATAPRES - DOSED IN MG/24 HR) 0.3 mg/24hr patch Place 1 patch onto the skin once a week.   Yes [provider]  clopidogrel (PLAVIX) 75 MG tablet Take 75 mg by mouth daily.   Yes [provider]  glipiZIDE (GLUCOTROL) 10 MG tablet Take 1 tablet by mouth 2 (two) times daily.   Yes [provider]  hydrALAZINE (APRESOLINE) 50 MG tablet Take 100 mg by mouth 2 (two) times daily.    Yes [provider]  ipratropium-albuterol (DUONEB) 0.5-2.5 (3) MG/3ML SOLN Take 3 mLs by nebulization every 6 (six) hours as needed.   Yes [provider]  linagliptin (TRADJENTA) 5 MG TABS tablet Take 5 mg by mouth daily.   Yes [provider]  pravastatin (PRAVACHOL) 80 MG tablet Take 80 mg by mouth daily.   Yes [provider]  sodium bicarbonate 650 MG tablet Take 650 mg  by mouth 2 (two) times daily.   Yes [provider]  vitamin B-12 (CYANOCOBALAMIN) 1000 MCG tablet Take 1,000 mcg by mouth daily.   Yes [provider]  ferrous sulfate 325 (65 FE) MG tablet Take 325 mg by mouth 2 (two) times daily with a meal.    [provider]  glucosamine-chondroitin 500-400 MG tablet Take 1 tablet by mouth daily.    [provider]  ranitidine (ZANTAC) 150 MG tablet Take 150 mg by mouth 2 (two) times daily.     [provider]  saccharomyces boulardii (FLORASTOR) 250 MG capsule Take 1 capsule (250 mg total) by mouth 2 (two) times daily. Patient not taking: Reported on 06/02/2017 04/20/15   Nicholes Mango, MD  sodium citrate-citric acid (ORACIT) 500-334 MG/5ML solution Take 30 mLs by mouth once.    [provider]  timolol (BETIMOL) 0.5 % ophthalmic solution Place 1 drop into both eyes 2 (two) times daily.    [provider]   Allergies  Allergen Reactions  . Sulfa Antibiotics     FAMILY HISTORY:  family history includes Diabetes Mellitus II in her mother; Diabetes type II in her brother and sister; Hypertension in her father. SOCIAL HISTORY:  reports that she has never smoked. She has never used smokeless tobacco. She reports that she does not drink alcohol or use drugs.  REVIEW OF SYSTEMS: Positives in BOLD  Constitutional: Negative for fever, chills, weight loss, malaise/fatigue and diaphoresis.  HENT: Negative for hearing loss, ear pain, nosebleeds, congestion, sore throat, neck pain, tinnitus and ear discharge.   Eyes: Negative for blurred vision, double vision, photophobia, pain, discharge and redness.  Respiratory: cough, hemoptysis, sputum production, shortness of breath, wheezing and stridor.   Cardiovascular: chest pain, palpitations, orthopnea, claudication, leg swelling and PND.  Gastrointestinal: Negative for heartburn, nausea, vomiting, abdominal pain, diarrhea, constipation, blood in stool and melena.  Genitourinary: Negative for dysuria, urgency, frequency, hematuria and flank pain.  Musculoskeletal: Negative for myalgias, back pain, joint pain and falls.  Skin: Negative for itching and rash.  Neurological: Negative for dizziness, tingling, tremors, sensory change, speech change, focal weakness, seizures, loss of consciousness, weakness and headaches.  Endo/Heme/Allergies: Negative for environmental allergies and polydipsia. Does not bruise/bleed easily.   SUBJECTIVE:  c/o shortness of breath   VITAL SIGNS: Temp:  [90.1 F (32.3 C)-90.8 F (32.7 C)] 90.8 F (32.7 C) (05/11 2155) Pulse Rate:  [52-59] 59 (05/11 2300) Resp:  [11-19] 11 (05/11 2300) BP: (118-142)/(43-72) 118/45 (05/11 2300) SpO2:  [91 %-100 %] 96 % (05/11 2300) Weight:  [71.2 kg] 71.2 kg (05/11 1813)  PHYSICAL EXAMINATION: General: elderly female NAD resting in bed  Neuro: alert and oriented, follows commands  HEENT: supple, no JVD  Cardiovascular: sinus brady, no R/G  Lungs: expiratory wheezes with rhonchi throughout, even, non labored  Abdomen: +BS x4, soft, non tender, non distended  Musculoskeletal: 2+ bilateral lower extremity edema  Skin: intact no rashes or lesions   Recent Labs  Lab 02/16/2019 1825  NA 137  K 4.5  CL 100  CO2 17*  BUN 130*  CREATININE 7.58*  GLUCOSE 286*   Recent Labs  Lab 03/03/2019 1825  HGB 7.9*  HCT 23.7*  WBC 10.9*  PLT 175   Dg Chest Port 1 View  Result Date: 02/28/2019 CLINICAL DATA:  Shortness of breath. EXAM: PORTABLE CHEST 1 VIEW COMPARISON:  None FINDINGS: Right arm PICC line tip projects over the cavoatrial junction. The heart size appears normal. Lung volumes  appear low. Diffuse bilateral interstitial and airspace opacities identified compatible with either multifocal pneumonia, pulmonary edema or ARDS. Visualized osseous structures are unremarkable. IMPRESSION: 1. Diffuse bilateral pulmonary opacities which may represent pneumonia, pulmonary edema, and/or ARDS. Electronically Signed   By: Kerby Moors M.D.   On: 03/09/2019 18:54    ASSESSMENT / PLAN:  Acute hypoxic respiratory failure secondary to multifocal pneumonia vs. ARDS vs. pulmonary edema  COVID-19 negative  Supplemental O2 for dyspnea and/or hypoxia  Scheduled and prn bronchodilator therapy  Influenza panel, strep pneumoniae urinary antigen, and legionella results pending  Echo pending  Aggressive pulmonary hygiene  Due to worsening renal failure will  not diurese pt at this time  Bradycardia  Hx: CAD and HTN  Continuous telemetry monitoring  Continue plavix and pravachol Echo pending   Severe hypothermia  Prn bear hugger  Acute on chronic renal failure (baseline creatinine 3.02/GFR 14) Trend BMP  Replace electrolytes as indicated  Monitor UOP Avoid nephrotoxic medications Continue po sodium bicarb  Nephrology consulted appreciate input  Renal US pending   UTI Multifocal pneumonia  Trend WBC and monitor fever curve Will check PCT  Follow cultures  Continue cefepime and vancomycin   Transaminitis  Trend hepatic panel  Type II Diabetes Mellitus  CBG's ac/hs  SSI   Marda Stalker, [place] Pager [phone] (please enter 7 digits) PCCM Consult Pager [phone] (please enter 7 digits)

## 2019-02-21 ENCOUNTER — Inpatient Hospital Stay: Payer: Medicare Other

## 2019-02-21 ENCOUNTER — Inpatient Hospital Stay (HOSPITAL_COMMUNITY)
Admit: 2019-02-21 | Discharge: 2019-02-21 | Disposition: A | Discharge status: Home / Self Care | Payer: Medicare Other | Attending: Critical Care Medicine | Admitting: Critical Care Medicine

## 2019-02-21 DIAGNOSIS — J9690 Respiratory failure, unspecified, unspecified whether with hypoxia or hypercapnia: Secondary | ICD-10-CM

## 2019-02-21 DIAGNOSIS — Z7189 Other specified counseling: Secondary | ICD-10-CM

## 2019-02-21 DIAGNOSIS — Z515 Encounter for palliative care: Secondary | ICD-10-CM

## 2019-02-21 DIAGNOSIS — J9601 Acute respiratory failure with hypoxia: Secondary | ICD-10-CM

## 2019-02-21 DIAGNOSIS — I361 Nonrheumatic tricuspid (valve) insufficiency: Secondary | ICD-10-CM

## 2019-02-21 DIAGNOSIS — J189 Pneumonia, unspecified organism: Secondary | ICD-10-CM

## 2019-02-21 LAB — INFLUENZA PANEL BY PCR (TYPE A & B)
Influenza A By PCR: NEGATIVE
Influenza B By PCR: NEGATIVE

## 2019-02-21 LAB — CBC WITH DIFFERENTIAL/PLATELET
Abs Immature Granulocytes: 0.04 10*3/uL (ref 0.00–0.07)
Basophils Absolute: 0 10*3/uL (ref 0.0–0.1)
Basophils Relative: 0 %
Eosinophils Absolute: 0 10*3/uL (ref 0.0–0.5)
Eosinophils Relative: 1 %
HCT: 22.9 % — ABNORMAL LOW (ref 36.0–46.0)
Hemoglobin: 7.4 g/dL — ABNORMAL LOW (ref 12.0–15.0)
Immature Granulocytes: 1 %
Lymphocytes Relative: 3 %
Lymphs Abs: 0.3 10*3/uL — ABNORMAL LOW (ref 0.7–4.0)
MCH: 27.7 pg (ref 26.0–34.0)
MCHC: 32.3 g/dL (ref 30.0–36.0)
MCV: 85.8 fL (ref 80.0–100.0)
Monocytes Absolute: 0.4 10*3/uL (ref 0.1–1.0)
Monocytes Relative: 5 %
Neutro Abs: 8 10*3/uL — ABNORMAL HIGH (ref 1.7–7.7)
Neutrophils Relative %: 90 %
Platelets: 171 10*3/uL (ref 150–400)
RBC: 2.67 MIL/uL — ABNORMAL LOW (ref 3.87–5.11)
RDW: 15.9 % — ABNORMAL HIGH (ref 11.5–15.5)
WBC: 8.8 10*3/uL (ref 4.0–10.5)
nRBC: 2.3 % — ABNORMAL HIGH (ref 0.0–0.2)

## 2019-02-21 LAB — STREP PNEUMONIAE URINARY ANTIGEN: Strep Pneumo Urinary Antigen: NEGATIVE

## 2019-02-21 LAB — GLUCOSE, CAPILLARY
Glucose-Capillary: 295 mg/dL — ABNORMAL HIGH (ref 70–99)
Glucose-Capillary: 146 mg/dL — ABNORMAL HIGH (ref 70–99)
Glucose-Capillary: 182 mg/dL — ABNORMAL HIGH (ref 70–99)
Glucose-Capillary: 182 mg/dL — ABNORMAL HIGH (ref 70–99)

## 2019-02-21 LAB — PROCALCITONIN: Procalcitonin: 1.42 ng/mL

## 2019-02-21 LAB — HEMOGLOBIN A1C
Hgb A1c MFr Bld: 5.7 % — ABNORMAL HIGH (ref 4.8–5.6)
Mean Plasma Glucose: 116.89 mg/dL

## 2019-02-21 LAB — BASIC METABOLIC PANEL
Anion gap: 19 — ABNORMAL HIGH (ref 5–15)
BUN: 126 mg/dL — ABNORMAL HIGH (ref 8–23)
CO2: 19 mmol/L — ABNORMAL LOW (ref 22–32)
Calcium: 7 mg/dL — ABNORMAL LOW (ref 8.9–10.3)
Chloride: 101 mmol/L (ref 98–111)
Creatinine, Ser: 7.34 mg/dL — ABNORMAL HIGH (ref 0.44–1.00)
GFR calc Af Amer: 5 mL/min — ABNORMAL LOW (ref >60)
GFR calc non Af Amer: 4 mL/min — ABNORMAL LOW (ref >60)
Glucose, Bld: 233 mg/dL — ABNORMAL HIGH (ref 70–99)
Potassium: 4.3 mmol/L (ref 3.5–5.1)
Sodium: 139 mmol/L (ref 135–145)

## 2019-02-21 LAB — MRSA PCR SCREENING: MRSA by PCR: NEGATIVE

## 2019-02-21 LAB — ECHOCARDIOGRAM COMPLETE
Height: 69 in
Weight: 2512 oz

## 2019-02-21 MED ORDER — ONDANSETRON HCL 4 MG/2ML IJ SOLN
4.0000 mg | Freq: Four times a day (QID) | INTRAMUSCULAR | Status: DC | PRN
  Administered 2019-02-21: 15:00:00 4 mg via INTRAVENOUS
  Filled 2019-02-21: qty 2

## 2019-02-21 MED ORDER — ATROPINE SULFATE 1 MG/10ML IJ SOSY
PREFILLED_SYRINGE | INTRAMUSCULAR | Status: AC
  Administered 2019-02-21: 14:00:00 1 mg
  Filled 2019-02-21: qty 10

## 2019-02-21 MED ORDER — HEPARIN SODIUM (PORCINE) 5000 UNIT/ML IJ SOLN
5000.0000 [IU] | Freq: Two times a day (BID) | INTRAMUSCULAR | Status: DC
  Administered 2019-02-21: 23:00:00 5000 [IU] via SUBCUTANEOUS
  Filled 2019-02-21: qty 1

## 2019-02-21 MED ORDER — HEPARIN SODIUM (PORCINE) 5000 UNIT/ML IJ SOLN: 5000.0000 [IU] | Freq: Three times a day (TID) | INTRAMUSCULAR | Status: DC

## 2019-02-21 MED ORDER — OXYCODONE HCL 20 MG/ML PO CONC
2.5000 mg | ORAL | Status: DC | PRN
  Filled 2019-02-21: qty 1

## 2019-02-21 MED ORDER — VANCOMYCIN VARIABLE DOSE PER UNSTABLE RENAL FUNCTION (PHARMACIST DOSING): Status: DC

## 2019-02-21 MED ORDER — FUROSEMIDE 10 MG/ML IJ SOLN
80.0000 mg | Freq: Once | INTRAMUSCULAR | Status: AC
  Administered 2019-02-21: 12:00:00 80 mg via INTRAVENOUS
  Filled 2019-02-21: qty 8

## 2019-02-21 MED ORDER — FUROSEMIDE 10 MG/ML IJ SOLN
8.0000 mg/h | INTRAVENOUS | Status: DC
  Administered 2019-02-21: 12:00:00 8 mg/h via INTRAVENOUS
  Filled 2019-02-21: qty 25

## 2019-02-21 NOTE — Progress Notes (Signed)
Inpatient Diabetes Program Recommendations  AACE/ADA: New Consensus Statement on Inpatient Glycemic Control   Target Ranges:  Prepandial:   less than 140 mg/dL      Peak postprandial:   less than 180 mg/dL (1-2 hours)      Critically ill patients:  140 - 180 mg/dL   Results for MAIRANY, BRUNO (MRN [id number]) as of 02/21/2019 10:59  Ref. Range 03/01/2019 23:39  Glucose-Capillary Latest Ref Range: 70 - 99 mg/dL 295 (H)  Results for KINZE, LABO (MRN [id number]) as of 02/21/2019 10:59  Ref. Range 02/21/2019 18:25 02/21/2019 04:18  Glucose Latest Ref Range: 70 - 99 mg/dL 286 (H) 233 (H)   Review of Glycemic Control  Diabetes history: DM2 Outpatient Diabetes medications: Glipizide 10 mg BID, Tradjenta 5 mg daily Current orders for Inpatient glycemic control: Novolog 0-15 units TID with meals, Novolog 0-5 units QHS, Tradjenta 5 mg daily  Inpatient Diabetes Program Recommendations:   Insulin - Basal: If glucose is consistently greater than 180 mg/dl with Novolog correction, please consider ordering Levemir 5 units Q24H.  Thanks, Barnie Alderman, RN, MSN, CDE Diabetes Coordinator Inpatient Diabetes Program [phone] (Team Pager from 8am to 5pm)

## 2019-02-21 NOTE — Progress Notes (Signed)
Spoke by phone with patient's daughter, Ms Ahley Bulls [phone] Her brother Mr Jenicka Coxe was with her [phone] We discussed that patient is severely ill and her condition is critical.  She has worsening renal failure and is probably end-stage renal disease.  She now has severe volume overload which is not responding adequately to IV Lasix infusion.  Patient's daughter stated that patient knew about her declining kidney function for about a year and a half and has resisted dialysis.  Patient also expressed this morning that at her age and general condition, she did not want to deal with chronic dialysis.  She is familiar with the process as she watched her sister-in-law go through it many years ago.  She had a bad experience because of the burden involved.  Explained that she is likely ESRD and we will be looking at long term dialysis. Patient will have to go through many invasive medical procedures including dialysis access placement,AV access placement etc.  It is not even clear if she will be able to tolerate these procedures and/ or dialysis.  This was explained to patient's family and they all agreed that medical therapy should be maximized at this point but no invasive treatments to be done including CPR, ventilator, dialysis etc.  They wanted to come in and see her. They stated that if there is no improvement in her medical condition by tomorrow morning, they will proceed with comfort care.  All this information was conveyed to ICU team and they will contact the family for them to come in and see the patient.

## 2019-02-21 NOTE — Consult Note (Signed)
Consultation Note Date: 02/21/2019   Patient Name: Sonia Simpson  DOB: 01-Jan-1921  MRN: [id number]  Age / Sex: 85 y.o., female  PCP: Sonia Merles, MD Referring Physician: Sela Hua, MD  Reason for Consultation: Establishing goals of care  HPI/Patient Profile: 85 y.o. female  with past medical history of HTN, glaucoma, CAD, T2DM, CKD, recent discharge from [facility] for pneumonia admitted on 02/27/2019 with findings of multifocal pneumonia, pulmonary edema, acute on chronic renal failure, and acute on chronic CHF. Palliative medicine consulted for Sonia Simpson.   Clinical Assessment and Goals of Care: Met with patient at bedside. She was alert and oriented.  Brief life review- widowed, has three children (one is a Personal assistant at [employer] [place], one is a Animal nutritionist, on is in the [employer]). 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  Prior to admission she was living at home independently with caregivers would visit and clean and cook for her. She continued to go to church. Gave herself baths, but had assistance from family members for showering. She states there were plans in place to hire 24 hour care as she was feeling the need for more assistance. She notes it had been getting harder for her to get around.   Her current health status and trajectory was discussed.  Advanced health care planning was discussed.  She has a living will and does not wish for her life to be prolonged artificially on ventilator or other artificial measures. We discussed DNR status and she agreed that if she went into cardiac and respiratory arrest she would not want resuscitative efforts.  She discussed her thoughts regarding dialysis. She notes she has had other family members for whom dialysis have not improved their quality of life. We discussed what is important to her- prolonging her time vs improving quality.  She would like to prolong her time, but not at the expense of decreasing her quality.  She states she would consider starting dialysis to see how it affected her, and if it did not help make her feel better, or made her feel worse she would make a decision to stop it.  For now, Sonia Simpson is wanting to feel better and recover to point where she can return home and see her family. Patient's has complaints of overall feeling poorly, SOB.  Addendum- I received call back to ICU- patient experienced moment of bradycardia and respiratory decline and confusion. She recovered with atropine administration. Patient is stating she "just feels sick".  I called patient's daughterDemetrius Simpson and informed them of incident and my discussion with patient re: DNR status. Sonia Simpson agreed. I additionally discussed options of continued aggressive medical care vs transition to comfort care measures, allowing family to be present and support through end of life process. I explained to Sonia Simpson patient is at risk for sudden cardiac death. Answered Sonia Simpson's questions r/t patient's current status and trajectory.  Sonia Simpson is interested in discussing options with nephrology- mainly has question re: would temporary dialysis possibly improve patient's status to a  point to be able to discharge her home?  I contacted Sonia Simpson via Facetime in order to review patient's chart with Sonia Simpson and allow Sonia Simpson to communicate with patient.   Primary Decision Maker PATIENT (when she is alert and oriented, patient's children when she is not)    SUMMARY OF RECOMMENDATIONS -DNR -Continue current care -Recommend nephrology discuss possible outcomes of dialysis with patient's daughter (patient's RN to contact nephrology- thank you) -Oxycodone solution 2.51m po q4hr prn for SOB, agitation     Code Status/Advance Care Planning:  DNR  Primary Diagnoses: Present on Admission: . Pneumonia  I have reviewed the medical record, interviewed the patient and family,  and examined the patient. The following aspects are pertinent.  Past Medical History:  Diagnosis Date  . Coronary artery disease   . Diabetes mellitus without complication ([place]   . Glaucoma   . Hypertension   . Renal disorder    Scheduled Meds: . aspirin EC  81 mg Oral Daily  . calcium acetate (Phos Binder)  667 mg Oral TID WC  . cholecalciferol  5,000 Units Oral Daily  . clopidogrel  75 mg Oral Daily  . famotidine  20 mg Oral Daily  . ferrous sulfate  325 mg Oral BID WC  . heparin injection (subcutaneous)  5,000 Units Subcutaneous Q12H  . insulin aspart  0-15 Units Subcutaneous TID WC  . insulin aspart  0-5 Units Subcutaneous QHS  . linagliptin  5 mg Oral Daily  . pravastatin  80 mg Oral Daily  . sodium bicarbonate  650 mg Oral BID  . timolol  1 drop Both Eyes BID  . [START ON 5May 27, 2020 vancomycin variable dose per unstable renal function (pharmacist dosing)   Does not apply See admin instructions  . vitamin B-12  1,000 mcg Oral Daily   Continuous Infusions: . ceFEPime (MAXIPIME) IV    . furosemide (LASIX) infusion 8 mg/hr (02/21/19 1152)   PRN Meds:.ipratropium-albuterol, ondansetron (ZOFRAN) IV, oxyCODONE Medications Prior to Admission:  Prior to Admission medications   Medication Sig Start Date End Date Taking? Authorizing Provider  amLODipine (NORVASC) 10 MG tablet Take 1 tablet by mouth daily. 01/29/14  Yes [provider]  aspirin 81 MG tablet Take 81 mg by mouth daily.   Yes [provider]  calcium acetate, Phos Binder, (PHOSLYRA) 667 MG/5ML SOLN Take 667 mg by mouth 3 (three) times daily with meals.   Yes [provider]  carvedilol (COREG) 25 MG tablet Take 0.5 tablets by mouth 2 (two) times daily.    Yes [provider]  Cholecalciferol (CVS D3) 5000 units capsule Take 5,000 Units by mouth daily.   Yes [provider]  cloNIDine (CATAPRES - DOSED IN MG/24 HR) 0.3 mg/24hr patch Place 1 patch onto the skin once a week.    Yes [provider]  clopidogrel (PLAVIX) 75 MG tablet Take 75 mg by mouth daily.   Yes [provider]  glipiZIDE (GLUCOTROL) 10 MG tablet Take 1 tablet by mouth 2 (two) times daily.   Yes [provider]  hydrALAZINE (APRESOLINE) 50 MG tablet Take 100 mg by mouth 2 (two) times daily.    Yes [provider]  ipratropium-albuterol (DUONEB) 0.5-2.5 (3) MG/3ML SOLN Take 3 mLs by nebulization every 6 (six) hours as needed.   Yes [provider]  linagliptin (TRADJENTA) 5 MG TABS tablet Take 5 mg by mouth daily.   Yes [provider]  pravastatin (PRAVACHOL) 80 MG tablet Take 80 mg by mouth  daily.   Yes [provider]  sodium bicarbonate 650 MG tablet Take 650 mg by mouth 2 (two) times daily.   Yes [provider]  vitamin B-12 (CYANOCOBALAMIN) 1000 MCG tablet Take 1,000 mcg by mouth daily.   Yes [provider]  ferrous sulfate 325 (65 FE) MG tablet Take 325 mg by mouth 2 (two) times daily with a meal.    [provider]  glucosamine-chondroitin 500-400 MG tablet Take 1 tablet by mouth daily.    [provider]  ranitidine (ZANTAC) 150 MG tablet Take 150 mg by mouth 2 (two) times daily.    [provider]  saccharomyces boulardii (FLORASTOR) 250 MG capsule Take 1 capsule (250 mg total) by mouth 2 (two) times daily. Patient not taking: Reported on 06/02/2017 04/20/15   Nicholes Mango, MD  sodium citrate-citric acid (ORACIT) 500-334 MG/5ML solution Take 30 mLs by mouth once.    [provider]  timolol (BETIMOL) 0.5 % ophthalmic solution Place 1 drop into both eyes 2 (two) times daily.    [provider]   Allergies  Allergen Reactions  . Sulfa Antibiotics      Physical Exam Vitals signs and nursing note reviewed.  Constitutional:      Appearance: She is well-developed.  Pulmonary:     Effort: Pulmonary effort is normal.  Abdominal:     Palpations: Abdomen is soft.   Skin:    General: Skin is warm and dry.  Neurological:     Mental Status: She is alert and oriented to person, place, and time.  Psychiatric:        Mood and Affect: Mood normal.     Vital Signs: BP (!) 189/120   Pulse 95   Temp (!) 93.8 F (34.3 C) (Axillary)   Resp 11   Ht _0  (1.753 m)   Wt 71.2 kg   SpO2 (!) 53%   BMI 23.18 kg/m  Pain Scale: 0-10   Pain Score: 0-No pain   O2 Flow Rate: .O2 Flow Rate (L/min): 15 L/min  IO: Intake/output summary:   Intake/Output Summary (Last 24 hours) at 02/21/2019 1519 Last data filed at 02/21/2019 0530 Gross per 24 hour  Intake -  Output 125 ml  Net -125 ml    LBM:   Baseline Weight: Weight: 71.2 kg Most recent weight: Weight: 71.2 kg     Palliative Assessment/Data: PPS: 20%     Thank you for this consult. Palliative medicine will continue to follow and assist as needed.   Time In: 1030 Time Out: 1230 Time Total: 120 minutes Prolonged time: Yes  Greater than 50%  of this time was spent counseling and coordinating care related to the above assessment and plan.  Signed by: Mariana Kaufman, AGNP-C Palliative Medicine    Please contact Palliative Medicine Team phone at [phone] for questions and concerns.  For individual provider: See Shea Evans

## 2019-02-21 NOTE — Plan of Care (Signed)
  Problem: Education: Goal: Knowledge of General Education information will improve Description Including pain rating scale, medication(s)/side effects and non-pharmacologic comfort measures Outcome: Progressing   Problem: Health Behavior/Discharge Planning: Goal: Ability to manage health-related needs will improve Outcome: Progressing   Problem: Clinical Measurements: Goal: Ability to maintain clinical measurements within normal limits will improve Outcome: Progressing Goal: Will remain free from infection Outcome: Progressing Goal: Diagnostic test results will improve Outcome: Progressing Goal: Respiratory complications will improve Outcome: Progressing Note:  Oxygen weaned down as tolerated Goal: Cardiovascular complication will be avoided Outcome: Progressing   Problem: Activity: Goal: Risk for activity intolerance will decrease Outcome: Progressing

## 2019-02-21 NOTE — Progress Notes (Deleted)
Patient admitted early this morning.  Seen and examined by me later in the morning.  Please see H&P for further details.  Patient remains on 5 L O2 this morning.  Still feeling short of breath.  -Continue vancomycin and cefepime for multifocal pneumonia -On lasix drip per nephrology recommendations -Echo pending  Hyman Bible, MD

## 2019-02-21 NOTE — Consult Note (Signed)
[facility]  CARDIOLOGY CONSULT NOTE  Patient ID: Sonia Simpson MRN: [id number] DOB/AGE: 1920-10-27 85 y.o.  Admit date: 02/21/2019 Referring Physician Gardiner Barefoot, NP Primary Physician Dr. Delight Stare  Primary Cardiologist Dr. Serafina Royals  Reason for Consultation Shortness of breath   HPI: Sonia Simpson is a 85 year old female with a past medical history significant for coronary artery disease s/p PCI to proximal LAD in 2009, type 2 diabetes, chronic kidney disease, history of CVA, and hypertension who presented to the ED on 03/06/2019 for a 1 day history of worsening shortness of breath.  Per EMS, O2 saturation was 80%, but improved to 96% on 4L of nasal cannula.  Chest xray was significant for diffuse bilateral pulmonary opacities consistent with pneumonia vs pulmonary edema vs ARDS.  BNP was slightly elevated at 172.  Creatinine was 7.58, GFR was 5.  Troponin was negative x1.  Of note, she was recently admitted for pneumonia and was still being treated with antibiotics.    Sonia Simpson is a poor historian but appears to be in moderate stress.  Admits to pain all over, but cannot provide a further history.    Echocardiogram in 2017 revealed normal LV function with mild LVH.  No evidence of significant valvular regurgitation or valvular stenosis.  Cardiac catheterization in 2009 at [facility] revealed a proximal LAD lesion that was successfully treated with a Xience DES.   Review of systems complete and found to be negative unless listed above     Past Medical History:  Diagnosis Date  . Coronary artery disease   . Diabetes mellitus without complication ([place])   . Glaucoma   . Hypertension   . Renal disorder     Past Surgical History:  Procedure Laterality Date  . CORONARY ANGIOPLASTY WITH STENT PLACEMENT     2009    Medications Prior to Admission  Medication Sig Dispense Refill Last Dose  . amLODipine (NORVASC) 10 MG tablet Take 1 tablet by mouth daily.   02/28/2019 at  Unknown time  . aspirin 81 MG tablet Take 81 mg by mouth daily.   03/12/2019 at Unknown time  . calcium acetate, Phos Binder, (PHOSLYRA) 667 MG/5ML SOLN Take 667 mg by mouth 3 (three) times daily with meals.   03/02/2019 at Unknown time  . carvedilol (COREG) 25 MG tablet Take 0.5 tablets by mouth 2 (two) times daily.    02/10/2019 at Unknown time  . Cholecalciferol (CVS D3) 5000 units capsule Take 5,000 Units by mouth daily.   03/10/2019 at Unknown time  . cloNIDine (CATAPRES - DOSED IN MG/24 HR) 0.3 mg/24hr patch Place 1 patch onto the skin once a week.   unknown at unknown  . clopidogrel (PLAVIX) 75 MG tablet Take 75 mg by mouth daily.   03/11/2019 at Unknown time  . glipiZIDE (GLUCOTROL) 10 MG tablet Take 1 tablet by mouth 2 (two) times daily.   02/21/2019 at Unknown time  . hydrALAZINE (APRESOLINE) 50 MG tablet Take 100 mg by mouth 2 (two) times daily.    02/14/2019 at Unknown time  . ipratropium-albuterol (DUONEB) 0.5-2.5 (3) MG/3ML SOLN Take 3 mLs by nebulization every 6 (six) hours as needed.   prn at prn  . linagliptin (TRADJENTA) 5 MG TABS tablet Take 5 mg by mouth daily.   03/06/2019 at Unknown time  . pravastatin (PRAVACHOL) 80 MG tablet Take 80 mg by mouth daily.   02/21/2019 at Unknown time  . sodium bicarbonate 650 MG tablet Take 650 mg  by mouth 2 (two) times daily.   02/15/2019 at Unknown time  . vitamin B-12 (CYANOCOBALAMIN) 1000 MCG tablet Take 1,000 mcg by mouth daily.   03/06/2019 at Unknown time  . ferrous sulfate 325 (65 FE) MG tablet Take 325 mg by mouth 2 (two) times daily with a meal.   Not Taking at Unknown time  . glucosamine-chondroitin 500-400 MG tablet Take 1 tablet by mouth daily.   Not Taking at Unknown time  . ranitidine (ZANTAC) 150 MG tablet Take 150 mg by mouth 2 (two) times daily.   Not Taking at Unknown time  . saccharomyces boulardii (FLORASTOR) 250 MG capsule Take 1 capsule (250 mg total) by mouth 2 (two) times daily. (Patient not taking: Reported on 06/02/2017) 20 capsule  0 Not Taking at Unknown time  . sodium citrate-citric acid (ORACIT) 500-334 MG/5ML solution Take 30 mLs by mouth once.   Not Taking at Unknown time  . timolol (BETIMOL) 0.5 % ophthalmic solution Place 1 drop into both eyes 2 (two) times daily.   Not Taking at Unknown time   Social History   Socioeconomic History  . Marital status: Widowed    Spouse name: Not on file  . Number of children: Not on file  . Years of education: Not on file  . Highest education level: Not on file  Occupational History  . Not on file  Social Needs  . Financial resource strain: Not on file  . Food insecurity:    Worry: Not on file    Inability: Not on file  . Transportation needs:    Medical: Not on file    Non-medical: Not on file  Tobacco Use  . Smoking status: Never Smoker  . Smokeless tobacco: Never Used  Substance and Sexual Activity  . Alcohol use: No  . Drug use: No  . Sexual activity: Not on file  Lifestyle  . Physical activity:    Days per week: Not on file    Minutes per session: Not on file  . Stress: Not on file  Relationships  . Social connections:    Talks on phone: Not on file    Gets together: Not on file    Attends religious service: Not on file    Active member of club or organization: Not on file    Attends meetings of clubs or organizations: Not on file    Relationship status: Not on file  . Intimate partner violence:    Fear of current or ex partner: Not on file    Emotionally abused: Not on file    Physically abused: Not on file    Forced sexual activity: Not on file  Other Topics Concern  . Not on file  Social History Narrative  . Not on file    Family History  Problem Relation Age of Onset  . Diabetes Mellitus II Mother   . Hypertension Father   . Diabetes type II Sister   . Diabetes type II Brother       Review of systems complete and found to be negative unless listed above      PHYSICAL EXAM  General: Sitting up in bed, uncomfortable, in mild  distress HEENT:  Normocephalic and atramatic Neck:  No JVD.  Lungs: On nasal cannula.  Expiratory wheezes noted in upper and lower lung fields bilaterally  Heart: HRRR . Normal S1 and S2 without gallops or murmurs.  Abdomen: Bowel sounds are positive, abdomen soft and non-tender  Msk:  Back normal.  Normal strength and tone for age. Extremities: No clubbing, cyanosis or edema.   Neuro: Poor historian  Psych:  Poor historian   Labs:   Lab Results  Component Value Date   WBC 8.8 02/21/2019   HGB 7.4 (L) 02/21/2019   HCT 22.9 (L) 02/21/2019   MCV 85.8 02/21/2019   PLT 171 02/21/2019    Recent Labs  Lab 02/11/2019 1825 02/21/19 0418  NA 137 139  K 4.5 4.3  CL 100 101  CO2 17* 19*  BUN 130* 126*  CREATININE 7.58* 7.34*  CALCIUM 7.0* 7.0*  PROT 6.2*  --   BILITOT 1.4*  --   ALKPHOS 95  --   ALT 107*  --   AST 197*  --   GLUCOSE 286* 233*   Lab Results  Component Value Date   TROPONINI <0.03 03/12/2019   No results found for: CHOL No results found for: HDL No results found for: LDLCALC No results found for: TRIG No results found for: CHOLHDL No results found for: LDLDIRECT    Radiology: US Renal  Result Date: 02/21/2019 CLINICAL DATA:  Acute kidney injury. EXAM: RENAL / URINARY TRACT ULTRASOUND COMPLETE COMPARISON:  None. FINDINGS: Right Kidney: Renal measurements: 8.3 x 5.5 x 4.8 cm = volume: 115.3 mL. Increased echogenicity. No mass or hydronephrosis. Left Kidney: Renal measurements: 7.9 x 4.4 x 4.9 cm = volume: 89.5 mL. Increased echogenicity. No mass or hydronephrosis. Bladder: Decompressed by Foley catheter. IMPRESSION: BILATERAL renal atrophy and increased echogenicity consistent with medical renal disease. No obstruction or renal mass. Electronically Signed   By: Staci Righter M.D.   On: 02/21/2019 10:17   Dg Chest Port 1 View  Result Date: 02/23/2019 CLINICAL DATA:  Shortness of breath. EXAM: PORTABLE CHEST 1 VIEW COMPARISON:  None FINDINGS: Right arm PICC line  tip projects over the cavoatrial junction. The heart size appears normal. Lung volumes appear low. Diffuse bilateral interstitial and airspace opacities identified compatible with either multifocal pneumonia, pulmonary edema or ARDS. Visualized osseous structures are unremarkable. IMPRESSION: 1. Diffuse bilateral pulmonary opacities which may represent pneumonia, pulmonary edema, and/or ARDS. Electronically Signed   By: Kerby Moors M.D.   On: 02/12/2019 18:54    EKG: Sinus bradycardia   ASSESSMENT AND PLAN:  1.  Acute pulmonary edema/pneumonia   -Continue trial of Lasix infusion with Is and Os  -Continue antibiotic therapy   -Echocardiogram pending  2.  Renal failure   -Renal ultrasound ordered, nephrology on board  3.  Type 2 diabetes  -Continue sliding scale insulin   The history, physical exam findings, and plan of care were all discussed with Dr. Bartholome Bill, and all decision making was made in collaboration.   Signed: Avie Arenas PA-C 02/21/2019, 1:00 PM

## 2019-02-21 NOTE — Progress Notes (Signed)
Pts urine output total of 125 mL since arrived to unit from ED. Renal consult already ordered. Creatinine up to 7.3. Maylon Cos notified and aware.

## 2019-02-21 NOTE — Progress Notes (Signed)
*  PRELIMINARY RESULTS* Echocardiogram 2D Echocardiogram has been performed.  Sonia Simpson 02/21/2019, 11:03 AM

## 2019-02-21 NOTE — Consult Note (Signed)
Pharmacy Antibiotic Note  Sonia Simpson is a 85 y.o. female admitted on 02/18/2019 with pneumonia.  Pharmacy has been consulted for Vancomycin and Cefepime dosing.  Plan: Vancomycin 1000mg  IV x 1 dose given in ED. Will give an additional 500mg  dose for a total of 1500mg  loading dose. Since patient's renal function is unstable we will plan to dose by levels. Random vancomycin level order for 5/13 AM.   Start Cefepime 1g IV every 24 hours.   Height: 5\' 9"  (175.3 cm) Weight: 157 lb (71.2 kg) IBW/kg (Calculated) : 66.2  Temp (24hrs), Avg:90.5 F (32.5 C), Min:90.1 F (32.3 C), Max:90.8 F (32.7 C)  Recent Labs  Lab 02/21/2019 1825  WBC 10.9*  CREATININE 7.58*  LATICACIDVEN 0.7    Estimated Creatinine Clearance: 4.5 mL/min (A) (by C-G formula based on SCr of 7.58 mg/dL (H)).    Allergies  Allergen Reactions  . Sulfa Antibiotics     Antimicrobials this admission: 5/11 vancomycin  >>  5/11 cefepime >>   Microbiology results: 5/11 BCx: pending 5/11 Sputum: pending 5/11 SARS Coronavirus 2: negative  5/12 MRSA PCR: pending  Thank you for allowing pharmacy to be a part of this patient's care.  Pernell Dupre, PharmD, BCPS Clinical Pharmacist 02/21/2019 12:24 AM

## 2019-02-21 NOTE — Progress Notes (Signed)
[place] [facility] [place] NAME: Sonia Simpson    MR#:  [id number]  DATE OF BIRTH:  October 15, 1920  SUBJECTIVE:   Patient states that her breathing is little bit better today.  No fevers or chills.  REVIEW OF SYSTEMS:  Review of Systems  Constitutional: Negative for chills and fever.  HENT: Negative for congestion and sore throat.   Eyes: Negative for blurred vision and double vision.  Respiratory: Positive for cough and shortness of breath.   Cardiovascular: Negative for chest pain and palpitations.  Gastrointestinal: Negative for nausea and vomiting.  Genitourinary: Negative for dysuria and urgency.  Musculoskeletal: Negative for back pain and neck pain.  Neurological: Negative for dizziness and headaches.  Psychiatric/Behavioral: Negative for depression. The patient is not nervous/anxious.     DRUG ALLERGIES:   Allergies  Allergen Reactions   Sulfa Antibiotics    VITALS:  Blood pressure (!) 147/55, pulse (!) 59, temperature (!) 93.8 F (34.3 C), temperature source Axillary, resp. rate 14, height 5\' 9"  (1.753 m), weight 71.2 kg, SpO2 95 %. PHYSICAL EXAMINATION:  Physical Exam  GENERAL:  Laying in the bed with no acute distress.  HEENT: Head atraumatic, normocephalic. Pupils equal, round, reactive to light and accommodation. No scleral icterus. Extraocular muscles intact. Oropharynx and nasopharynx clear.  NECK:  Supple, no jugular venous distention. No thyroid enlargement. LUNGS: + Diffuse rhonchi present. + Bibasilar crackles.  Nasal cannula in place. No use of accessory muscles of respiration.  CARDIOVASCULAR: Bradycardic, regular rhythm, S1, S2 normal. No murmurs, rubs, or gallops.  ABDOMEN: Soft, nontender, nondistended. Bowel sounds present.  EXTREMITIES: No pedal edema, cyanosis, or clubbing.  NEUROLOGIC: CN 2-12 intact, no focal deficits. 5/5 muscle strength throughout all extremities. Sensation intact throughout. Gait not  checked.  PSYCHIATRIC: The patient is alert and oriented x 3.  SKIN: No obvious rash, lesion, or ulcer.  LABORATORY PANEL:  Female CBC Recent Labs  Lab 02/21/19 0418  WBC 8.8  HGB 7.4*  HCT 22.9*  PLT 171   ------------------------------------------------------------------------------------------------------------------ Chemistries  Recent Labs  Lab 02/12/2019 1825 02/21/19 0418  NA 137 139  K 4.5 4.3  CL 100 101  CO2 17* 19*  GLUCOSE 286* 233*  BUN 130* 126*  CREATININE 7.58* 7.34*  CALCIUM 7.0* 7.0*  AST 197*  --   ALT 107*  --   ALKPHOS 95  --   BILITOT 1.4*  --    RADIOLOGY:  US Renal  Result Date: 02/21/2019 CLINICAL DATA:  Acute kidney injury. EXAM: RENAL / URINARY TRACT ULTRASOUND COMPLETE COMPARISON:  None. FINDINGS: Right Kidney: Renal measurements: 8.3 x 5.5 x 4.8 cm = volume: 115.3 mL. Increased echogenicity. No mass or hydronephrosis. Left Kidney: Renal measurements: 7.9 x 4.4 x 4.9 cm = volume: 89.5 mL. Increased echogenicity. No mass or hydronephrosis. Bladder: Decompressed by Foley catheter. IMPRESSION: BILATERAL renal atrophy and increased echogenicity consistent with medical renal disease. No obstruction or renal mass. Electronically Signed   By: Staci Righter M.D.   On: 02/21/2019 10:17   Dg Chest Port 1 View  Result Date: 02/18/2019 CLINICAL DATA:  Shortness of breath. EXAM: PORTABLE CHEST 1 VIEW COMPARISON:  None FINDINGS: Right arm PICC line tip projects over the cavoatrial junction. The heart size appears normal. Lung volumes appear low. Diffuse bilateral interstitial and airspace opacities identified compatible with either multifocal pneumonia, pulmonary edema or ARDS. Visualized osseous structures are unremarkable. IMPRESSION: 1. Diffuse bilateral pulmonary opacities which may represent pneumonia, pulmonary edema,  and/or ARDS. Electronically Signed   By: Kerby Moors M.D.   On: 02/26/2019 18:54   ASSESSMENT AND PLAN:   Multifocal pneumonia- not  meeting sepsis criteria on admission. - Continue vancomycin and cefepime - DuoNebs every 6 hours - Sputum culture and blood cultures are pending  Acute on chronic diastolic congestive heart failure - ECHO - Cardiology consulted by admitting provider - Nephrology recommending trial of Lasix drip - She has O2 nasal cannula as well as DuoNebs every 6 hours for shortness of breath. - Cardiac monitoring  Acute on chronic renal failure- creatinine slightly better today. - Renal ultrasound with chronic disease - Nephrology consulted- recommend Lasix drip - ECHO  Elevated liver function studies - Monitor  Type 2 diabetes - Moderate sliding scale insulin  All the records are reviewed and case discussed with Care Management/Social Worker. Management plans discussed with the patient, family and they are in agreement.  CODE STATUS: DNR  TOTAL TIME TAKING CARE OF THIS PATIENT: 45 minutes.   More than 50% of the time was spent in counseling/coordination of care: YES  POSSIBLE D/C IN 2-3 DAYS, DEPENDING ON CLINICAL CONDITION.   Berna Spare Griselda Tosh M.D on 02/21/2019 at 1:28 PM  Between 7am to 6pm - Pager - [phone]  After 6pm go to [email or web address] - Proofreader  [facility]  Office  [phone]  CC: Primary care physician; Marguerita Merles, MD  Note: This dictation was prepared with Dragon dictation along with smaller phrase technology. Any transcriptional errors that result from this process are unintentional.

## 2019-02-21 NOTE — Progress Notes (Signed)
Notified Dr Candiss Norse that patient's daughter wanted to discuss dialysis with him.  Patient already made DNR with palliative NP.  I informed Dr Candiss Norse that Lasix IVP followed by continuous infusion was not effective for patient and patient's UOP remained unchanged, as well as coarse/fine crackles increasing throughout lungs.  Patient's oxygen requirements increased as well to 15L HFNC from 4LNC.  Patient states she "feels awful, just sick all over."  Patient is more lethargic.    Per Dr Candiss Norse, patient's daughter said no invasive procedures such as dialysis or intubation.  Per Dr Mortimer Fries, patient's family invited to come to patient's bedside and be with her as she is actively dying slowly.

## 2019-02-21 NOTE — Progress Notes (Signed)
eLink Physician-Brief Progress Note Patient Name: Sonia Simpson DOB: 10/02/1921 MRN: [id number]   Date of Service  02/21/2019  HPI/Events of Note  85 yo female with a PMH of HTN, Glaucoma, Type II Diabetes Mellitus, CAD, and Renal Disorder.  She presented to [facility] ER via [facility] from home with shortness of breath.  Per ER notes she was recently discharged from another hospital at the beginning of this month following treatment of pneumonia. COVID-19 rapid test negative. Now admitted to ICU for management of pneumonia. PCCM asked to assume care. VSS.  eICU Interventions  No new orders.      Intervention Category Evaluation Type: New Patient Evaluation  Lysle Dingwall 02/21/2019, 12:04 AM

## 2019-02-21 NOTE — Progress Notes (Signed)
[facility], [place] 02/21/19  Subjective:   Patient known to our practice from previous admission in 2018 She was brought to the emergency room from home via EMS for Complaints of Shortness of Breath and Nausea.  Recent Admission for Pneumonia at [facility].  Patient Had Decreased Oxygen Saturation on Room Air Which Improved to 96% on 4 L Patient was initially noted to be hypothermic therefore admitted to ICU.  Chest x-ray was concerning for multifocal pneumonia versus ARDS versus pulmonary edema. Urinalysis showed proteinuria and WBC count of 21-50 Admission labs showed pH of 7.35, elevated creatinine of 7.58, GFR 5, BUN of 130  Objective:  Vital signs in last 24 hours:  Temp:  [90.1 F (32.3 C)-93.8 F (34.3 C)] 93.8 F (34.3 C) (05/12 0000) Pulse Rate:  [52-63] 59 (05/12 0700) Resp:  [11-19] 14 (05/12 0700) BP: (118-147)/(43-72) 147/55 (05/12 0700) SpO2:  [91 %-100 %] 95 % (05/12 0700) Weight:  [71.2 kg] 71.2 kg (05/11 1813)  Weight change:  Filed Weights   02/21/2019 1813  Weight: 71.2 kg    Intake/Output:    Intake/Output Summary (Last 24 hours) at 02/21/2019 6389 Last data filed at 02/21/2019 0530 Gross per 24 hour  Intake -  Output 125 ml  Net -125 ml     Physical Exam: General:  Frail, elderly, laying in the bed  HEENT  moist oral mucous membranes  Neck  supple, no JVD  Pulm/lungs  bilateral diffuse crackles, oxygen by nasal cannula  CVS/Heart  regular rhythm  Abdomen:   Soft, nontender  Extremities:  2+ pitting edema arms and legs, wearing compression socks  Neurologic:  Alert and oriented  Skin:  No acute rashes    Foley in place       Basic Metabolic Panel:  Recent Labs  Lab 03/12/2019 1825 02/21/19 0418  NA 137 139  K 4.5 4.3  CL 100 101  CO2 17* 19*  GLUCOSE 286* 233*  BUN 130* 126*  CREATININE 7.58* 7.34*  CALCIUM 7.0* 7.0*     CBC: Recent Labs  Lab 02/24/2019 1825 02/21/19 0418  WBC 10.9* 8.8  NEUTROABS  10.2* 8.0*  HGB 7.9* 7.4*  HCT 23.7* 22.9*  MCV 84.3 85.8  PLT 175 171     No results found for: HEPBSAG, HEPBSAB, HEPBIGM    Microbiology:  Recent Results (from the past 240 hour(s))  Culture, blood (Routine x 2)     Status: None (Preliminary result)   Collection Time: 03/07/2019  6:52 PM  Result Value Ref Range Status   Specimen Description BLOOD BLOOD LEFT FOREARM  Final   Special Requests   Final    BOTTLES DRAWN AEROBIC AND ANAEROBIC Blood Culture adequate volume   Culture   Final    NO GROWTH < 12 HOURS Performed at [facility], [address] [place], [place] [address]    Report Status PENDING  Incomplete  Culture, blood (Routine x 2)     Status: None (Preliminary result)   Collection Time: 03/08/2019  6:52 PM  Result Value Ref Range Status   Specimen Description BLOOD BLOOD LEFT HAND  Final   Special Requests   Final    BOTTLES DRAWN AEROBIC AND ANAEROBIC Blood Culture adequate volume   Culture   Final    NO GROWTH < 12 HOURS Performed at [facility], [address] [place], [place] [address]    Report Status PENDING  Incomplete  SARS Coronavirus 2 (CEPHEID- Performed in [place]  hospital lab), Hosp Order     Status: None   Collection Time: 02/12/2019  6:52 PM  Result Value Ref Range Status   SARS Coronavirus 2 NEGATIVE NEGATIVE Final    Comment: (NOTE) If result is NEGATIVE SARS-CoV-2 target nucleic acids are NOT DETECTED. The SARS-CoV-2 RNA is generally detectable in upper and lower  respiratory specimens during the acute phase of infection. The lowest  concentration of SARS-CoV-2 viral copies this assay can detect is 250  copies / mL. A negative result does not preclude SARS-CoV-2 infection  and should not be used as the sole basis for treatment or other  patient management decisions.  A negative result may occur with  improper specimen collection / handling, submission of specimen other  than nasopharyngeal swab, presence of viral  mutation(s) within the  areas targeted by this assay, and inadequate number of viral copies  (<250 copies / mL). A negative result must be combined with clinical  observations, patient history, and epidemiological information. If result is POSITIVE SARS-CoV-2 target nucleic acids are DETECTED. The SARS-CoV-2 RNA is generally detectable in upper and lower  respiratory specimens dur ing the acute phase of infection.  Positive  results are indicative of active infection with SARS-CoV-2.  Clinical  correlation with patient history and other diagnostic information is  necessary to determine patient infection status.  Positive results do  not rule out bacterial infection or co-infection with other viruses. If result is PRESUMPTIVE POSTIVE SARS-CoV-2 nucleic acids MAY BE PRESENT.   A presumptive positive result was obtained on the submitted specimen  and confirmed on repeat testing.  While 2019 novel coronavirus  (SARS-CoV-2) nucleic acids may be present in the submitted sample  additional confirmatory testing may be necessary for epidemiological  and / or clinical management purposes  to differentiate between  SARS-CoV-2 and other Sarbecovirus currently known to infect humans.  If clinically indicated additional testing with an alternate test  methodology [phone]) is advised. The SARS-CoV-2 RNA is generally  detectable in upper and lower respiratory sp ecimens during the acute  phase of infection. The expected result is Negative. Fact Sheet for Patients:  StrictlyIdeas.no Fact Sheet for Healthcare Providers: BankingDealers.co.za This test is not yet approved or cleared by the [place] FDA and has been authorized for detection and/or diagnosis of SARS-CoV-2 by FDA under an Emergency Use Authorization (EUA).  This EUA will remain in effect (meaning this test can be used) for the duration of the COVID-19 declaration under Section 564(b)(1)  of the Act, 21 U.S.C. section 360bbb-3(b)(1), unless the authorization is terminated or revoked sooner. Performed at [facility], [address] [place], [place] [address]     Coagulation Studies: Recent Labs    02/12/2019 1825  LABPROT 16.2*  INR 1.3*    Urinalysis: Recent Labs    03/01/2019 1852  COLORURINE YELLOW*  LABSPEC 1.014  PHURINE 5.0  GLUCOSEU NEGATIVE  HGBUR NEGATIVE  BILIRUBINUR NEGATIVE  KETONESUR NEGATIVE  PROTEINUR 100*  NITRITE NEGATIVE  LEUKOCYTESUR MODERATE*      Imaging: Dg Chest Port 1 View  Result Date: 03/09/2019 CLINICAL DATA:  Shortness of breath. EXAM: PORTABLE CHEST 1 VIEW COMPARISON:  None FINDINGS: Right arm PICC line tip projects over the cavoatrial junction. The heart size appears normal. Lung volumes appear low. Diffuse bilateral interstitial and airspace opacities identified compatible with either multifocal pneumonia, pulmonary edema or ARDS. Visualized osseous structures are unremarkable. IMPRESSION: 1. Diffuse bilateral pulmonary opacities which may represent pneumonia, pulmonary edema, and/or ARDS.  Electronically Signed   By: Kerby Moors M.D.   On: 03/04/2019 18:54     Medications:   . ceFEPime (MAXIPIME) IV     . aspirin EC  81 mg Oral Daily  . calcium acetate (Phos Binder)  667 mg Oral TID WC  . cholecalciferol  5,000 Units Oral Daily  . clopidogrel  75 mg Oral Daily  . famotidine  20 mg Oral Daily  . ferrous sulfate  325 mg Oral BID WC  . furosemide  80 mg Intravenous Once  . heparin injection (subcutaneous)  5,000 Units Subcutaneous Q12H  . insulin aspart  0-15 Units Subcutaneous TID WC  . insulin aspart  0-5 Units Subcutaneous QHS  . linagliptin  5 mg Oral Daily  . pravastatin  80 mg Oral Daily  . sodium bicarbonate  650 mg Oral BID  . timolol  1 drop Both Eyes BID  . [START ON 03/03/2019] vancomycin variable dose per unstable renal function (pharmacist dosing)   Does not apply See admin instructions  .  vitamin B-12  1,000 mcg Oral Daily   ipratropium-albuterol  Assessment/ Plan:  85 y.o. African-American female with coronary disease, diabetes, hypertension, chronic kidney disease  1.  Acute renal failure 2.  Acute pulmonary edema 3.  Chronic kidney disease stage 5 ?  Baseline creatinine 4.12/GFR 10 from August 2018 4.  Diabetes type 2 with chronic kidney disease.  Hemoglobin A1c 7.5% from August 2018  According to admission notes, patient had a recent admission in the hospital for pneumonia.  Renal ultrasound has been ordered for work-up of acute renal failure.  Her baseline creatinine appears to be 4.12 from August 2018.  No other records are available for review at this time. Trial of IV Lasix infusion for volume overload Dialysis can be considered but with her age and general condition, she may not tolerated well.  She may do better with conservative management.  Agree with palliative care evaluation and discussion with family.      LOS: [place] 5/12/20209:18 AM  [place], [place]  Note: This note was prepared with Dragon dictation. Any transcription errors are unintentional

## 2019-02-21 NOTE — H&P (Signed)
[facility] [place] NAME: Sonia Simpson    MR#:  [id number]  DATE OF BIRTH:  23-Mar-1921  DATE OF ADMISSION:  02/14/2019  PRIMARY CARE PHYSICIAN: Marguerita Merles, MD   REQUESTING/REFERRING PHYSICIAN: Arta Silence, MD  CHIEF COMPLAINT:   Chief Complaint  Patient presents with  . Shortness of Breath    HISTORY OF PRESENT ILLNESS:  Sonia Simpson  is a 85 y.o. female with a known history of coronary artery disease, diabetes mellitus, glaucoma, hypertension, and CKD.  Patient presented to the emergency room from home via EMS services with worsening shortness of breath and weakness.  According to EMS services, patient had oxygen saturation of 80% on room air on their arrival.  She currently has oxygen at 6 L/min nasal cannula with oxygen saturation in the mid to upper 90s.  Patient reports recent hospitalization with treatment for pneumonia having completed oral antibiotic therapy within the last month.  However she began experiencing increased shortness of breath over the last 7 to 10 days with increasing fatigue as well.  Patient has been nauseated however denies vomiting.  She has noted no fevers or chills.  In fact, patient is hypothermic with temperature 90.8 on her arrival.  She has warming blanket in place.  She denies chest pain.  Patient continues to live independently with assistance from hired caregivers and family.  On arrival to the emergency room pertinent labs include elevated BUN and creatinine with BUN 130 and creatinine 7.58.  Also with AST 197 and ALT 107 with bilirubin 1.4.  BNP is 172.  Troponin is 0.03.  CBC is 10.9.  Rapid COVID-19 testing is negative.  Chest x-ray demonstrates diffuse bilateral pulmonary opacities possibly representing pneumonia versus pulmonary edema.  Hospital service has admitted patient with plans for admission to stepdown/ICU.  PAST MEDICAL HISTORY:   Past Medical History:  Diagnosis Date  .  Coronary artery disease   . Diabetes mellitus without complication ([place])   . Glaucoma   . Hypertension   . Renal disorder     PAST SURGICAL HISTORY:   Past Surgical History:  Procedure Laterality Date  . CORONARY ANGIOPLASTY WITH STENT PLACEMENT     2009    SOCIAL HISTORY:   Social History   Tobacco Use  . Smoking status: Never Smoker  . Smokeless tobacco: Never Used  Substance Use Topics  . Alcohol use: No    FAMILY HISTORY:   Family History  Problem Relation Age of Onset  . Diabetes Mellitus II Mother   . Hypertension Father   . Diabetes type II Sister   . Diabetes type II Brother     DRUG ALLERGIES:   Allergies  Allergen Reactions  . Sulfa Antibiotics     REVIEW OF SYSTEMS:   Review of Systems  Constitutional: Positive for malaise/fatigue. Negative for chills and fever.  HENT: Positive for congestion. Negative for sinus pain and sore throat.   Eyes: Negative for blurred vision and double vision.  Respiratory: Positive for cough, shortness of breath and wheezing. Negative for hemoptysis and sputum production.   Cardiovascular: Negative for chest pain, palpitations and leg swelling.  Gastrointestinal: Positive for nausea. Negative for abdominal pain, diarrhea and vomiting.  Genitourinary: Negative for dysuria and frequency.  Musculoskeletal: Negative for falls and myalgias.  Skin: Negative for itching and rash.  Neurological: Negative for dizziness and headaches.  Psychiatric/Behavioral: Negative.       MEDICATIONS AT HOME:   Prior to  Admission medications   Medication Sig Start Date End Date Taking? Authorizing Provider  amLODipine (NORVASC) 10 MG tablet Take 1 tablet by mouth daily. 01/29/14  Yes [provider]  aspirin 81 MG tablet Take 81 mg by mouth daily.   Yes [provider]  calcium acetate, Phos Binder, (PHOSLYRA) 667 MG/5ML SOLN Take 667 mg by mouth 3 (three) times daily with meals.   Yes [provider]   carvedilol (COREG) 25 MG tablet Take 0.5 tablets by mouth 2 (two) times daily.    Yes [provider]  Cholecalciferol ([employer] D3) 5000 units capsule Take 5,000 Units by mouth daily.   Yes [provider]  cloNIDine (CATAPRES - DOSED IN MG/24 HR) 0.3 mg/24hr patch Place 1 patch onto the skin once a week.   Yes [provider]  clopidogrel (PLAVIX) 75 MG tablet Take 75 mg by mouth daily.   Yes [provider]  glipiZIDE (GLUCOTROL) 10 MG tablet Take 1 tablet by mouth 2 (two) times daily.   Yes [provider]  hydrALAZINE (APRESOLINE) 50 MG tablet Take 100 mg by mouth 2 (two) times daily.    Yes [provider]  ipratropium-albuterol (DUONEB) 0.5-2.5 (3) MG/3ML SOLN Take 3 mLs by nebulization every 6 (six) hours as needed.   Yes [provider]  linagliptin (TRADJENTA) 5 MG TABS tablet Take 5 mg by mouth daily.   Yes [provider]  pravastatin (PRAVACHOL) 80 MG tablet Take 80 mg by mouth daily.   Yes [provider]  sodium bicarbonate 650 MG tablet Take 650 mg by mouth 2 (two) times daily.   Yes [provider]  vitamin B-12 (CYANOCOBALAMIN) 1000 MCG tablet Take 1,000 mcg by mouth daily.   Yes [provider]  ferrous sulfate 325 (65 FE) MG tablet Take 325 mg by mouth 2 (two) times daily with a meal.    [provider]  glucosamine-chondroitin 500-400 MG tablet Take 1 tablet by mouth daily.    [provider]  ranitidine (ZANTAC) 150 MG tablet Take 150 mg by mouth 2 (two) times daily.    [provider]  saccharomyces boulardii (FLORASTOR) 250 MG capsule Take 1 capsule (250 mg total) by mouth 2 (two) times daily. Patient not taking: Reported on 06/02/2017 04/20/15   Nicholes Mango, MD  sodium citrate-citric acid (ORACIT) 500-334 MG/5ML solution Take 30 mLs by mouth once.    [provider]  timolol (BETIMOL) 0.5 % ophthalmic solution Place 1 drop into both eyes 2 (two)  times daily.    [provider]      VITAL SIGNS:  Blood pressure (!) 129/56, pulse 63, temperature (!) 93.8 F (34.3 C), temperature source Axillary, resp. rate 18, height 5\' 9"  (1.753 m), weight 71.2 kg, SpO2 95 %.  PHYSICAL EXAMINATION:  Physical Exam Constitutional:      Appearance: She is well-developed.  HENT:     Head: Normocephalic.  Eyes:     Extraocular Movements: Extraocular movements intact.     Pupils: Pupils are equal, round, and reactive to light.  Neck:     Musculoskeletal: Normal range of motion and neck supple.  Cardiovascular:     Rate and Rhythm: Normal rate and regular rhythm.     Heart sounds: No murmur. No friction rub.  Pulmonary:     Effort: Pulmonary effort is normal.     Breath sounds: Examination of the right-upper field reveals rhonchi. Examination of the left-upper field reveals rhonchi. Examination of  the right-middle field reveals rhonchi. Examination of the left-middle field reveals rhonchi. Examination of the right-lower field reveals rales. Examination of the left-lower field reveals rales. Rhonchi and rales present.  Chest:     Chest wall: No mass or tenderness.  Abdominal:     Palpations: There is no hepatomegaly or mass.     Tenderness: There is no abdominal tenderness. There is no guarding or rebound.  Musculoskeletal: Normal range of motion.     Right lower leg: She exhibits no tenderness. No edema.     Left lower leg: She exhibits no tenderness. No edema.  Skin:    General: Skin is warm and dry.     Capillary Refill: Capillary refill takes less than 2 seconds.     Findings: No rash.  Neurological:     General: No focal deficit present.     Mental Status: She is alert and oriented to person, place, and time.  Psychiatric:        Mood and Affect: Mood normal.     LABORATORY PANEL:   CBC Recent Labs  Lab 02/19/2019 1825  WBC 10.9*  HGB 7.9*  HCT 23.7*  PLT 175    ------------------------------------------------------------------------------------------------------------------  Chemistries  Recent Labs  Lab 02/17/2019 1825  NA 137  K 4.5  CL 100  CO2 17*  GLUCOSE 286*  BUN 130*  CREATININE 7.58*  CALCIUM 7.0*  AST 197*  ALT 107*  ALKPHOS 95  BILITOT 1.4*   ------------------------------------------------------------------------------------------------------------------  Cardiac Enzymes Recent Labs  Lab 02/21/2019 1825  TROPONINI <0.03   ------------------------------------------------------------------------------------------------------------------  RADIOLOGY:  Dg Chest Port 1 View  Result Date: 02/13/2019 CLINICAL DATA:  Shortness of breath. EXAM: PORTABLE CHEST 1 VIEW COMPARISON:  None FINDINGS: Right arm PICC line tip projects over the cavoatrial junction. The heart size appears normal. Lung volumes appear low. Diffuse bilateral interstitial and airspace opacities identified compatible with either multifocal pneumonia, pulmonary edema or ARDS. Visualized osseous structures are unremarkable. IMPRESSION: 1. Diffuse bilateral pulmonary opacities which may represent pneumonia, pulmonary edema, and/or ARDS. Electronically Signed   By: Kerby Moors M.D.   On: 02/21/2019 18:54      IMPRESSION AND PLAN:   1.  Multifocal pneumonia - She has been started on IV antibiotic therapy with vancomycin and cefepime.  She has O2 per nasal cannula - DuoNebs every 6 hours - Sputum culture and blood cultures are pending - Repeat CBC BMP in the a.m.  2.   Acute CHF likely diastolic - We will get echocardiogram - Likely secondary to acute on chronic renal failure with creatinine 7.58 and BUN 130. - Nephrology has been consulted regarding above.  We will also consult cardiology for further recommendations - She has O2 nasal cannula as well as DuoNebs every 6 hours for shortness of breath. - Telemetry monitoring  3 acute on chronic renal failure  - Renal ultrasound - Nephrology consulted - Planning echocardiogram in the a.m. as well with follow-up cardiology consultation.  4.  Elevated liver function studies - We will get right upper quadrant ultrasound for further evaluation  5.  Diabetes mellitus - Moderate sliding scale insulin    All the records are reviewed and case discussed with ED provider. The plan of care was discussed in details with the patient (and family). I answered all questions. The patient agreed to proceed with the above mentioned plan. Further management will depend upon hospital course.   CODE STATUS: Full code  TOTAL TIME TAKING CARE OF THIS PATIENT: 45 minutes.  Turner on 02/21/2019 at 2:24 AM  Pager - [phone]  After 6pm go to [email or web address] - Proofreader  [facility]  Office  [phone]  CC: Primary care physician; Marguerita Merles, MD   Note: This dictation was prepared with Dragon dictation along with smaller phrase technology. Any transcriptional errors that result from this process are unintentional.

## 2019-02-22 ENCOUNTER — Inpatient Hospital Stay: Payer: Medicare Other

## 2019-02-22 DIAGNOSIS — Z515 Encounter for palliative care: Secondary | ICD-10-CM

## 2019-02-22 LAB — COMPREHENSIVE METABOLIC PANEL
ALT: 76 U/L — ABNORMAL HIGH (ref 0–44)
AST: 86 U/L — ABNORMAL HIGH (ref 15–41)
Albumin: 2.6 g/dL — ABNORMAL LOW (ref 3.5–5.0)
Alkaline Phosphatase: 86 U/L (ref 38–126)
Anion gap: 18 — ABNORMAL HIGH (ref 5–15)
BUN: 134 mg/dL — ABNORMAL HIGH (ref 8–23)
CO2: 18 mmol/L — ABNORMAL LOW (ref 22–32)
Calcium: 6.9 mg/dL — ABNORMAL LOW (ref 8.9–10.3)
Chloride: 104 mmol/L (ref 98–111)
Creatinine, Ser: 7.5 mg/dL — ABNORMAL HIGH (ref 0.44–1.00)
GFR calc Af Amer: 5 mL/min — ABNORMAL LOW (ref >60)
GFR calc non Af Amer: 4 mL/min — ABNORMAL LOW (ref >60)
Glucose, Bld: 178 mg/dL — ABNORMAL HIGH (ref 70–99)
Potassium: 4.9 mmol/L (ref 3.5–5.1)
Sodium: 140 mmol/L (ref 135–145)
Total Bilirubin: 1.1 mg/dL (ref 0.3–1.2)
Total Protein: 5.8 g/dL — ABNORMAL LOW (ref 6.5–8.1)

## 2019-02-22 LAB — CBC
HCT: 24.4 % — ABNORMAL LOW (ref 36.0–46.0)
Hemoglobin: 7.6 g/dL — ABNORMAL LOW (ref 12.0–15.0)
MCH: 27.3 pg (ref 26.0–34.0)
MCHC: 31.1 g/dL (ref 30.0–36.0)
MCV: 87.8 fL (ref 80.0–100.0)
Platelets: 152 10*3/uL (ref 150–400)
RBC: 2.78 MIL/uL — ABNORMAL LOW (ref 3.87–5.11)
RDW: 16.1 % — ABNORMAL HIGH (ref 11.5–15.5)
WBC: 6 10*3/uL (ref 4.0–10.5)
nRBC: 5.5 % — ABNORMAL HIGH (ref 0.0–0.2)

## 2019-02-22 LAB — GLUCOSE, CAPILLARY: Glucose-Capillary: 182 mg/dL — ABNORMAL HIGH (ref 70–99)

## 2019-02-22 LAB — HIV ANTIBODY (ROUTINE TESTING W REFLEX): HIV Screen 4th Generation wRfx: NONREACTIVE

## 2019-02-22 LAB — LEGIONELLA PNEUMOPHILA SEROGP 1 UR AG: L. pneumophila Serogp 1 Ur Ag: NEGATIVE

## 2019-02-22 MED ORDER — MORPHINE SULFATE (PF) 2 MG/ML IV SOLN
1.0000 mg | INTRAVENOUS | Status: DC | PRN
  Administered 2019-02-22: 02:00:00 2 mg via INTRAVENOUS
  Filled 2019-02-22: qty 1

## 2019-02-22 MED ORDER — LORAZEPAM 2 MG/ML IJ SOLN
0.5000 mg | Freq: Once | INTRAMUSCULAR | Status: AC
  Administered 2019-02-22: 02:00:00 0.5 mg via INTRAVENOUS
  Filled 2019-02-22: qty 1

## 2019-02-22 MED ORDER — MORPHINE SULFATE (PF) 2 MG/ML IV SOLN
2.0000 mg | Freq: Once | INTRAVENOUS | Status: AC
  Administered 2019-02-22: 2 mg via INTRAVENOUS

## 2019-02-22 MED ORDER — MORPHINE SULFATE (PF) 2 MG/ML IV SOLN
1.0000 mg | INTRAVENOUS | Status: DC | PRN
  Administered 2019-02-22: 1 mg via INTRAVENOUS
  Administered 2019-02-22: 08:00:00 2 mg via INTRAVENOUS
  Filled 2019-02-22 (×3): qty 1

## 2019-02-22 MED ORDER — MORPHINE SULFATE (PF) 2 MG/ML IV SOLN
2.0000 mg | INTRAVENOUS | Status: DC | PRN
  Administered 2019-02-22: 08:00:00 2 mg via INTRAVENOUS

## 2019-02-22 MED ORDER — HYDROMORPHONE BOLUS VIA INFUSION
0.5000 mg | INTRAVENOUS | Status: DC | PRN
  Filled 2019-02-22: qty 1

## 2019-02-22 MED ORDER — MORPHINE SULFATE (PF) 2 MG/ML IV SOLN
INTRAVENOUS | Status: AC
  Filled 2019-02-22: qty 1

## 2019-02-22 MED ORDER — SODIUM CHLORIDE 0.9 % IV SOLN
0.5000 mg/h | INTRAVENOUS | Status: DC
  Filled 2019-02-22: qty 2.5

## 2019-02-23 LAB — URINE CULTURE
Culture: >=100000 — AB
Culture: >=100000 — AB
Special Requests: NORMAL

## 2019-02-25 LAB — CULTURE, BLOOD (ROUTINE X 2)
Culture: NO GROWTH
Culture: NO GROWTH
Special Requests: ADEQUATE
Special Requests: ADEQUATE

## 2019-02-28 ENCOUNTER — Telehealth: Payer: Self-pay | Admitting: Pulmonary Disease

## 2019-02-28 NOTE — Telephone Encounter (Signed)
Death certificate has been placed in DS's folder.

## 2019-03-01 NOTE — Telephone Encounter (Signed)
Death Certificate complete, placed up front for pickup. [facility] aware.

## 2019-03-13 NOTE — Progress Notes (Signed)
Palliative note:   Received call from patient's daughter this morning that patient "had a rough night" and they were considering transitioning her to comfort measures.  Patient evaluated and found to be moaning, agitated, decreased oxygen saturations on NRB.  Called patient's daughter and informed were giving morphine for comfort and encouraged to come as soon as possible.  Patient's daughter arrived as patient was dying. Remained at bedside with family and gave support.   Sonia Simpson, AGNP-C Palliative Medicine  Time In: 0800  Time Out:0900 Total Time:60 minutes  Greater than 50%  of this time was spent counseling and coordinating care related to the above assessment and plan

## 2019-03-13 NOTE — Progress Notes (Signed)
Upon entering room pt moaning loudly. Respirations labored with 02 sat 75-80% on NRB mask. Pt repositioned for comfort. Reassurance provided. Call placed to family for update.

## 2019-03-13 NOTE — Progress Notes (Signed)
Post mortem care complete. Body to morgue.

## 2019-03-13 NOTE — Progress Notes (Signed)
Palliative nurse in to see pt. Pt family updated on condition. Family on the way to hospital to see pt.

## 2019-03-13 NOTE — Progress Notes (Signed)
Family at bedside with pt. Palliative Nurse at bedside with pt and family.

## 2019-03-13 NOTE — Discharge Summary (Signed)
[facility] [place] NAME: Sonia Simpson    MR#:  [id number]  DATE OF BIRTH:  19-Mar-1921  DATE OF ADMISSION:  02/11/2019   ADMITTING PHYSICIAN: Vaughan Basta, MD  DATE OF DISCHARGE: 23-Feb-2019 10:09 AM  PRIMARY CARE PHYSICIAN: Marguerita Merles, MD   ADMISSION DIAGNOSIS:  SOB (shortness of breath) [R06.02] Acute respiratory failure with hypoxia ([place]) [J96.01] AKI (acute kidney injury) ([place]) [N17.9] HCAP (healthcare-associated pneumonia) [J18.9] Hypothermia, initial encounter [T68.XXXA] Pneumonia [J18.9] DISCHARGE DIAGNOSIS:  Active Problems:   Pneumonia   Respiratory failure ([place])   HCAP (healthcare-associated pneumonia)   Palliative care by specialist   Advance care planning   Terminal care  SECONDARY DIAGNOSIS:   Past Medical History:  Diagnosis Date  . Coronary artery disease   . Diabetes mellitus without complication ([place])   . Glaucoma   . Hypertension   . Renal disorder    HOSPITAL COURSE:   Sonia Simpson is a 85 year old female who presented to the ED with shortness of breath and weakness.  On arrival to the ED, she was placed on 6 L O2 by nasal cannula.  Chest x-ray showed a multifocal pneumonia.  She was admitted for further management.  She was treated with empiric vancomycin and cefepime for a multifocal pneumonia.  She did not meet sepsis criteria and blood cultures were ultimately negative.  She was also treated with a Lasix drip for acute on chronic diastolic congestive heart failure, but the Lasix drip was unsuccessful.  She was seen by both cardiology and nephrology.  Her kidney function continued to worsen and she felt to have end-stage renal disease.  Patient declined dialysis.  She was seen by palliative care and was transitioned to full comfort care on 02/23/23, per family wishes.  She passed away on 2023-02-23.  CONSULTS OBTAINED:  Treatment Team:  [facility], [facility], MD DRUG ALLERGIES:   Allergies  Allergen  Reactions  . Sulfa Antibiotics    DISCHARGE CONDITION:  Expired   On the day of death:  VITAL SIGNS:  Blood pressure (!) 86/65, pulse (!) 52, temperature (!) 97.4 F (36.3 C), temperature source Axillary, resp. rate 13, height 5\' 9"  (1.753 m), weight 71.2 kg, SpO2 (!) 79 %. PHYSICAL EXAMINATION:  GENERAL:  85 y.o.-year-old patient lying in the bed with no acute distress.  EYES: Pupils equal, round, reactive to light and accommodation. No scleral icterus. Extraocular muscles intact.  HEENT: Head atraumatic, normocephalic. Oropharynx and nasopharynx clear.  NECK:  Supple, no jugular venous distention. No thyroid enlargement, no tenderness.  LUNGS: Normal work of breathing,  in place. CARDIOVASCULAR: RRR, S1, S2 normal. No murmurs, rubs, or gallops.  ABDOMEN: Soft, non-tender, non-distended. Bowel sounds present. No organomegaly or mass.  PSYCHIATRIC: The patient is drowsy. SKIN: No obvious rash, lesion, or ulcer.  DATA REVIEW:   CBC Recent Labs  Lab 2019-02-23 0546  WBC 6.0  HGB 7.6*  HCT 24.4*  PLT 152    Chemistries  Recent Labs  Lab Feb 23, 2019 0546  NA 140  K 4.9  CL 104  CO2 18*  GLUCOSE 178*  BUN 134*  CREATININE 7.50*  CALCIUM 6.9*  AST 86*  ALT 76*  ALKPHOS 86  BILITOT 1.1     Microbiology Results  Results for orders placed or performed during the hospital encounter of 02/25/2019  Culture, blood (Routine x 2)     Status: None   Collection Time: 03/10/2019  6:52 PM  Result Value Ref Range Status  Specimen Description BLOOD BLOOD LEFT FOREARM  Final   Special Requests   Final    BOTTLES DRAWN AEROBIC AND ANAEROBIC Blood Culture adequate volume   Culture   Final    NO GROWTH 5 DAYS Performed at [facility], [address] [place], [place] [address]    Report Status 02/25/2019 FINAL  Final  Culture, blood (Routine x 2)     Status: None   Collection Time: 02/19/2019  6:52 PM  Result Value Ref Range Status   Specimen Description BLOOD BLOOD LEFT  HAND  Final   Special Requests   Final    BOTTLES DRAWN AEROBIC AND ANAEROBIC Blood Culture adequate volume   Culture   Final    NO GROWTH 5 DAYS Performed at [facility], [address], [place], [place] [address]    Report Status 02/25/2019 FINAL  Final  SARS Coronavirus 2 (CEPHEID- Performed in [facility] hospital lab), Hosp Order     Status: None   Collection Time: 02/24/2019  6:52 PM  Result Value Ref Range Status   SARS Coronavirus 2 NEGATIVE NEGATIVE Final    Comment: (NOTE) If result is NEGATIVE SARS-CoV-2 target nucleic acids are NOT DETECTED. The SARS-CoV-2 RNA is generally detectable in upper and lower  respiratory specimens during the acute phase of infection. The lowest  concentration of SARS-CoV-2 viral copies this assay can detect is 250  copies / mL. A negative result does not preclude SARS-CoV-2 infection  and should not be used as the sole basis for treatment or other  patient management decisions.  A negative result may occur with  improper specimen collection / handling, submission of specimen other  than nasopharyngeal swab, presence of viral mutation(s) within the  areas targeted by this assay, and inadequate number of viral copies  (<250 copies / mL). A negative result must be combined with clinical  observations, patient history, and epidemiological information. If result is POSITIVE SARS-CoV-2 target nucleic acids are DETECTED. The SARS-CoV-2 RNA is generally detectable in upper and lower  respiratory specimens dur ing the acute phase of infection.  Positive  results are indicative of active infection with SARS-CoV-2.  Clinical  correlation with patient history and other diagnostic information is  necessary to determine patient infection status.  Positive results do  not rule out bacterial infection or co-infection with other viruses. If result is PRESUMPTIVE POSTIVE SARS-CoV-2 nucleic acids MAY BE PRESENT.   A presumptive positive result was  obtained on the submitted specimen  and confirmed on repeat testing.  While 2019 novel coronavirus  (SARS-CoV-2) nucleic acids may be present in the submitted sample  additional confirmatory testing may be necessary for epidemiological  and / or clinical management purposes  to differentiate between  SARS-CoV-2 and other Sarbecovirus currently known to infect humans.  If clinically indicated additional testing with an alternate test  methodology [phone]) is advised. The SARS-CoV-2 RNA is generally  detectable in upper and lower respiratory sp ecimens during the acute  phase of infection. The expected result is Negative. Fact Sheet for Patients:  StrictlyIdeas.no Fact Sheet for Healthcare Providers: BankingDealers.co.za This test is not yet approved or cleared by the [place] FDA and has been authorized for detection and/or diagnosis of SARS-CoV-2 by FDA under an Emergency Use Authorization (EUA).  This EUA will remain in effect (meaning this test can be used) for the duration of the COVID-19 declaration under Section 564(b)(1) of the Act, 21 U.S.C. section 360bbb-3(b)(1), unless the authorization is terminated  or revoked sooner. Performed at [facility], [address], [place], [place] [address]   Urine Culture     Status: Abnormal   Collection Time: 02/21/2019  6:52 PM  Result Value Ref Range Status   Specimen Description   Final    URINE, RANDOM Performed at [facility], [address], [place], [place] [address]    Special Requests   Final    NONE Performed at [facility], [address] [place], [place] [address]    Culture >=100,000 COLONIES/mL YEAST (A)  Final   Report Status 02/23/2019 FINAL  Final  Urine Culture     Status: Abnormal   Collection Time: 02/21/19  4:16 AM  Result Value Ref Range Status   Specimen Description   Final    URINE, CATHETERIZED Performed at [facility], [address], [place], [place] [address]    Special Requests   Final    vancomycin/ cefepime Normal Performed at [facility], [address] [place], [place] [address]    Culture >=100,000 COLONIES/mL YEAST (A)  Final   Report Status 02/23/2019 FINAL  Final  MRSA PCR Screening     Status: None   Collection Time: 02/21/19 11:26 AM  Result Value Ref Range Status   MRSA by PCR NEGATIVE NEGATIVE Final    Comment:        The GeneXpert MRSA Assay (FDA approved for NASAL specimens only), is one component of a comprehensive MRSA colonization surveillance program. It is not intended to diagnose MRSA infection nor to guide or monitor treatment for MRSA infections. Performed at [facility], [address], [place], [place] [address]     RADIOLOGY:  No results found.   Management plans discussed with the patient, family and they are in agreement.  CODE STATUS: Prior   TOTAL TIME TAKING CARE OF THIS PATIENT: 40 minutes.    Berna Spare Natanel Snavely M.D on 02/26/2019 at 1:30 PM  Between 7am to 6pm - Pager - [phone]  After 6pm go to [email or web address] - Proofreader  [facility]  Office  [phone]  CC: Primary care physician; Marguerita Merles, MD   Note: This dictation was prepared with Dragon dictation along with smaller phrase technology. Any transcriptional errors that result from this process are unintentional.

## 2019-03-13 NOTE — Progress Notes (Signed)
[facility], [place] Mar 14, 2019  Subjective:   Remains critically ill Moaning, anot able to answer questions UOP 550 cc with high dose lasix infusion Still has SOB   Objective:  Vital signs in last 24 hours:  Temp:  [97 F (36.1 C)-97.4 F (36.3 C)] 97.4 F (36.3 C) (05/12 2000) Pulse Rate:  [46-95] 55 (05/13 0500) Resp:  [9-22] 10 (05/13 0500) BP: (114-189)/(47-120) 117/51 (05/13 0500) SpO2:  [53 %-100 %] 99 % (05/13 0500) FiO2 (%):  [100 %] 100 % (05/12 1339)  Weight change:  Filed Weights   02/17/2019 1813  Weight: 71.2 kg    Intake/Output:    Intake/Output Summary (Last 24 hours) at 03-14-2019 0840 Last data filed at March 14, 2019 0200 Gross per 24 hour  Intake 212.69 ml  Output 550 ml  Net -337.31 ml     Physical Exam: General:  Frail, elderly, laying in the bed  HEENT  moist oral mucous membranes  Neck  supple, no JVD  Pulm/lungs  bilateral diffuse crackles, high flow oxygen  CVS/Heart irregular rhythm  Abdomen:   Soft, nontender  Extremities:  2+ pitting edema arms and legs, wearing compression socks  Neurologic:  moaning, agitated  Skin:  No acute rashes    Foley in place       Basic Metabolic Panel:  Recent Labs  Lab 02/10/2019 1825 02/21/19 0418 03-14-2019 0546  NA 137 139 140  K 4.5 4.3 4.9  CL 100 101 104  CO2 17* 19* 18*  GLUCOSE 286* 233* 178*  BUN 130* 126* 134*  CREATININE 7.58* 7.34* 7.50*  CALCIUM 7.0* 7.0* 6.9*     CBC: Recent Labs  Lab 02/16/2019 1825 02/21/19 0418 03/14/19 0546  WBC 10.9* 8.8 6.0  NEUTROABS 10.2* 8.0*  --   HGB 7.9* 7.4* 7.6*  HCT 23.7* 22.9* 24.4*  MCV 84.3 85.8 87.8  PLT 175 171 152     No results found for: HEPBSAG, HEPBSAB, HEPBIGM    Microbiology:  Recent Results (from the past 240 hour(s))  Culture, blood (Routine x 2)     Status: None (Preliminary result)   Collection Time: 02/28/2019  6:52 PM  Result Value Ref Range Status   Specimen Description BLOOD BLOOD LEFT  FOREARM  Final   Special Requests   Final    BOTTLES DRAWN AEROBIC AND ANAEROBIC Blood Culture adequate volume   Culture   Final    NO GROWTH 2 DAYS Performed at [facility], [address] [place], [place] [address]    Report Status PENDING  Incomplete  Culture, blood (Routine x 2)     Status: None (Preliminary result)   Collection Time: 02/21/2019  6:52 PM  Result Value Ref Range Status   Specimen Description BLOOD BLOOD LEFT HAND  Final   Special Requests   Final    BOTTLES DRAWN AEROBIC AND ANAEROBIC Blood Culture adequate volume   Culture   Final    NO GROWTH 2 DAYS Performed at [facility], [address], [place], [place] [address]    Report Status PENDING  Incomplete  SARS Coronavirus 2 (CEPHEID- Performed in [place] hospital lab), Hosp Order     Status: None   Collection Time: 03/11/2019  6:52 PM  Result Value Ref Range Status   SARS Coronavirus 2 NEGATIVE NEGATIVE Final    Comment: (NOTE) If result is NEGATIVE SARS-CoV-2 target nucleic acids are NOT DETECTED. The SARS-CoV-2 RNA is generally detectable in upper and lower  respiratory specimens  during the acute phase of infection. The lowest  concentration of SARS-CoV-2 viral copies this assay can detect is 250  copies / mL. A negative result does not preclude SARS-CoV-2 infection  and should not be used as the sole basis for treatment or other  patient management decisions.  A negative result may occur with  improper specimen collection / handling, submission of specimen other  than nasopharyngeal swab, presence of viral mutation(s) within the  areas targeted by this assay, and inadequate number of viral copies  (<250 copies / mL). A negative result must be combined with clinical  observations, patient history, and epidemiological information. If result is POSITIVE SARS-CoV-2 target nucleic acids are DETECTED. The SARS-CoV-2 RNA is generally detectable in upper and lower  respiratory specimens  dur ing the acute phase of infection.  Positive  results are indicative of active infection with SARS-CoV-2.  Clinical  correlation with patient history and other diagnostic information is  necessary to determine patient infection status.  Positive results do  not rule out bacterial infection or co-infection with other viruses. If result is PRESUMPTIVE POSTIVE SARS-CoV-2 nucleic acids MAY BE PRESENT.   A presumptive positive result was obtained on the submitted specimen  and confirmed on repeat testing.  While 2019 novel coronavirus  (SARS-CoV-2) nucleic acids may be present in the submitted sample  additional confirmatory testing may be necessary for epidemiological  and / or clinical management purposes  to differentiate between  SARS-CoV-2 and other Sarbecovirus currently known to infect humans.  If clinically indicated additional testing with an alternate test  methodology [phone]) is advised. The SARS-CoV-2 RNA is generally  detectable in upper and lower respiratory sp ecimens during the acute  phase of infection. The expected result is Negative. Fact Sheet for Patients:  StrictlyIdeas.no Fact Sheet for Healthcare Providers: BankingDealers.co.za This test is not yet approved or cleared by the [place] FDA and has been authorized for detection and/or diagnosis of SARS-CoV-2 by FDA under an Emergency Use Authorization (EUA).  This EUA will remain in effect (meaning this test can be used) for the duration of the COVID-19 declaration under Section 564(b)(1) of the Act, 21 U.S.C. section 360bbb-3(b)(1), unless the authorization is terminated or revoked sooner. Performed at [facility], [address] [place], [place] [address]   Urine Culture     Status: None (Preliminary result)   Collection Time: 02/18/2019  6:52 PM  Result Value Ref Range Status   Specimen Description   Final    URINE, RANDOM Performed at [facility], [address], [place], [place] [address]    Special Requests   Final    NONE Performed at [facility], [address], [place], [place] [address]    Culture   Final    CULTURE REINCUBATED FOR BETTER GROWTH Performed at [facility], [place] [address], [place], [place] [address]    Report Status PENDING  Incomplete  Urine Culture     Status: None (Preliminary result)   Collection Time: 02/21/19  4:16 AM  Result Value Ref Range Status   Specimen Description   Final    URINE, CATHETERIZED Performed at [facility], [address], [place], [place] [address]    Special Requests   Final    vancomycin/ cefepime Normal Performed at [facility], [address] [place], [place] [address]    Culture   Final    CULTURE REINCUBATED FOR BETTER GROWTH Performed at [facility], [address], [place], [place] [address]    Report Status PENDING  Incomplete  MRSA PCR Screening     Status: None   Collection Time: 02/21/19 11:26 AM  Result Value Ref Range Status   MRSA by PCR NEGATIVE NEGATIVE Final    Comment:        The GeneXpert MRSA Assay (FDA approved for NASAL specimens only), is one component of a comprehensive MRSA colonization surveillance program. It is not intended to diagnose MRSA infection nor to guide or monitor treatment for MRSA infections. Performed at [facility], [address] [place], [place] [address]     Coagulation Studies: Recent Labs    03/07/2019 1825  LABPROT 16.2*  INR 1.3*    Urinalysis: Recent Labs    02/23/2019 1852  COLORURINE YELLOW*  LABSPEC 1.014  PHURINE 5.0  GLUCOSEU NEGATIVE  HGBUR NEGATIVE  BILIRUBINUR NEGATIVE  KETONESUR NEGATIVE  PROTEINUR 100*  NITRITE NEGATIVE  LEUKOCYTESUR MODERATE*      Imaging: US Renal  Result Date: 02/21/2019 CLINICAL DATA:  Acute kidney injury. EXAM: RENAL / URINARY TRACT ULTRASOUND COMPLETE COMPARISON:  None. FINDINGS: Right  Kidney: Renal measurements: 8.3 x 5.5 x 4.8 cm = volume: 115.3 mL. Increased echogenicity. No mass or hydronephrosis. Left Kidney: Renal measurements: 7.9 x 4.4 x 4.9 cm = volume: 89.5 mL. Increased echogenicity. No mass or hydronephrosis. Bladder: Decompressed by Foley catheter. IMPRESSION: BILATERAL renal atrophy and increased echogenicity consistent with medical renal disease. No obstruction or renal mass. Electronically Signed   By: Staci Righter M.D.   On: 02/21/2019 10:17   Dg Chest Port 1 View  Result Date: 03-21-19 CLINICAL DATA:  85 year old female with multifocal pneumonia, acute on chronic congestive heart failure and renal failure. EXAM: PORTABLE CHEST 1 VIEW COMPARISON:  03/08/2019, CT Abdomen and Pelvis 06/02/2017. FINDINGS: Portable AP semi upright view at 0442 hours. Stable right PICC line. The patient is more rotated to the right. Worsening right upper lobe consolidation and air bronchograms. Confluent bibasilar opacity persists and obscures the diaphragm. Coarse widespread interstitial opacity in the left mid and upper lung persists. Visible mediastinal contours appear stable. Visible bowel-gas pattern within normal limits. IMPRESSION: 1. Progressive right upper pneumonia and consolidation. 2. Stable ventilation elsewhere with widespread pulmonary opacity and suspected bilateral pleural effusions with lower lobe collapse/consolidation. Electronically Signed   By: Genevie Ann M.D.   On: 03/21/2019 06:20   Dg Chest Port 1 View  Result Date: 02/21/2019 CLINICAL DATA:  Shortness of breath. EXAM: PORTABLE CHEST 1 VIEW COMPARISON:  None FINDINGS: Right arm PICC line tip projects over the cavoatrial junction. The heart size appears normal. Lung volumes appear low. Diffuse bilateral interstitial and airspace opacities identified compatible with either multifocal pneumonia, pulmonary edema or ARDS. Visualized osseous structures are unremarkable. IMPRESSION: 1. Diffuse bilateral pulmonary opacities  which may represent pneumonia, pulmonary edema, and/or ARDS. Electronically Signed   By: Kerby Moors M.D.   On: 03/12/2019 18:54     Medications:   . HYDROmorphone      HYDROmorphone, morphine injection, morphine injection  Assessment/ Plan:  85 y.o. African-American female with coronary disease, diabetes, hypertension, chronic kidney disease  1.  Acute renal failure 2.  Acute pulmonary edema, generalized volume overload 3.  Chronic kidney disease stage 5 ?  Baseline creatinine 4.12/GFR 10 from August 2018 4.  Diabetes type 2 with chronic kidney disease.  Hemoglobin A1c 7.5% from August 2018  Spoke with family yesterday. We concluded that due to her  frailty, aggressive treatments should not be pursued.  Maximal medical therapy only Palliative care team is following Family was leaning towards comfort care      LOS: [place] 06/05/208:40 AM  [place], [place]  Note: This note was prepared with Dragon dictation. Any transcription errors are unintentional

## 2019-03-13 DEATH — deceased

## 2019-05-24 IMAGING — CR DG HIP (WITH OR WITHOUT PELVIS) 2-3V*R*
3 series · 3 of 3 positions shown · non-contrast
Comparison: None.

CLINICAL DATA: Right hip pain for 2 weeks

EXAM:
DG HIP (WITH OR WITHOUT PELVIS) 3V RIGHT

[pelvis ap]
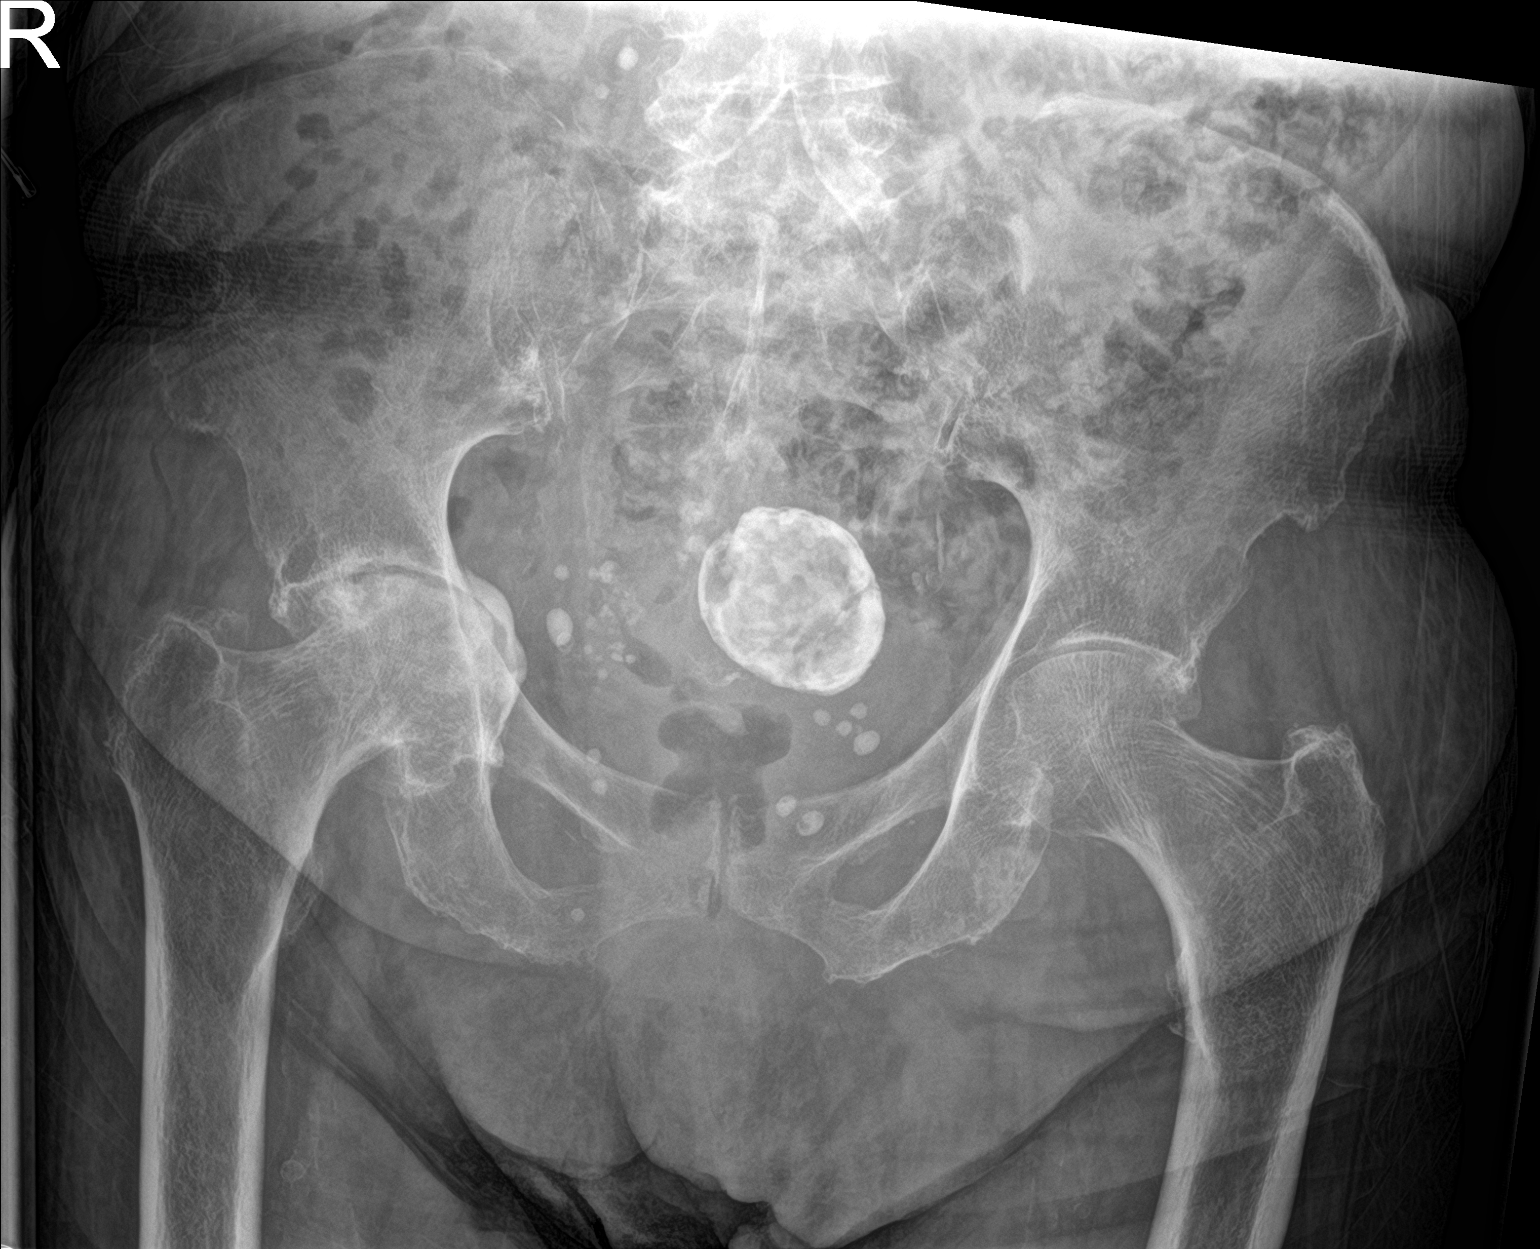

[hip ap]
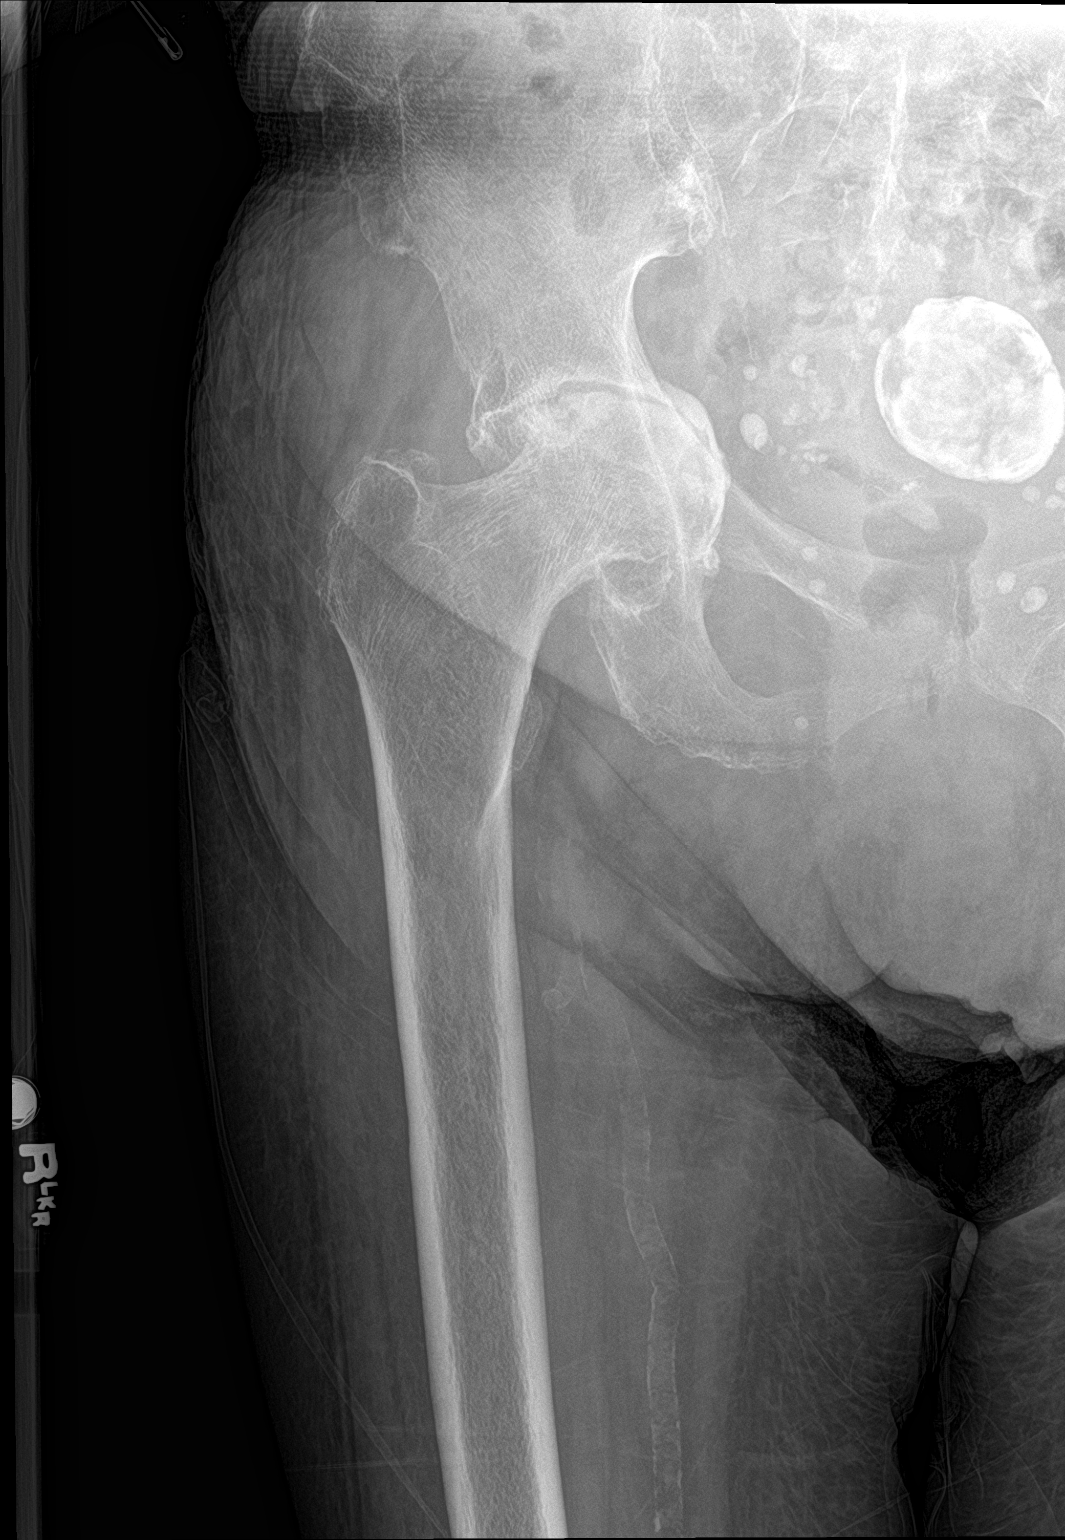

[hip lat]
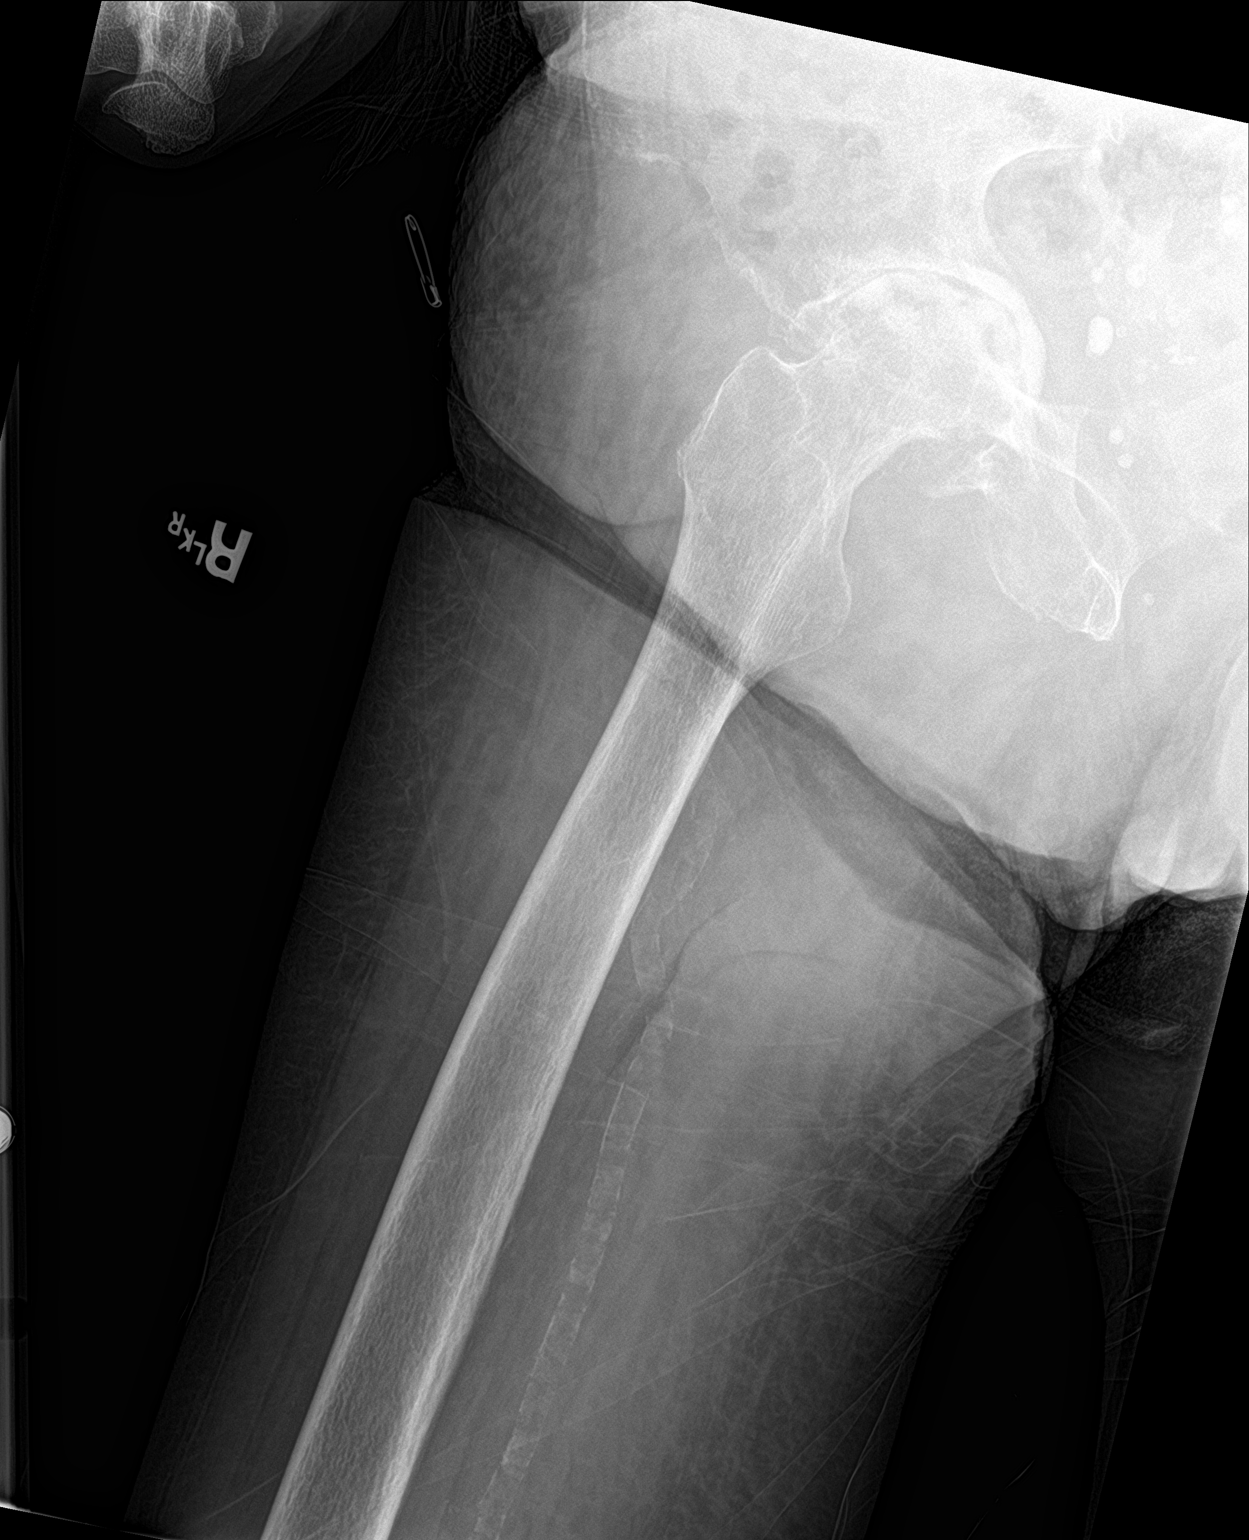

[3 of 3 positions shown; findings below may reference images not displayed]

FINDINGS: Significant degenerative changes of the right hip joint are noted
with remodeling of the femoral head and changes of acetabuli
protrusio. No acute fracture or dislocation is noted. Calcified
uterine fibroid is seen.
IMPRESSION: Significant degenerative changes as described. No acute abnormality
noted.

## 2020-01-09 IMAGING — US US RENAL
1 series · 14 of 25 positions shown · non-contrast
Comparison: None.

CLINICAL DATA: Acute kidney injury.

EXAM:
RENAL / URINARY TRACT ULTRASOUND COMPLETE

[Series 1: us renal · 14 of 30 slices shown]
[im 1/30]
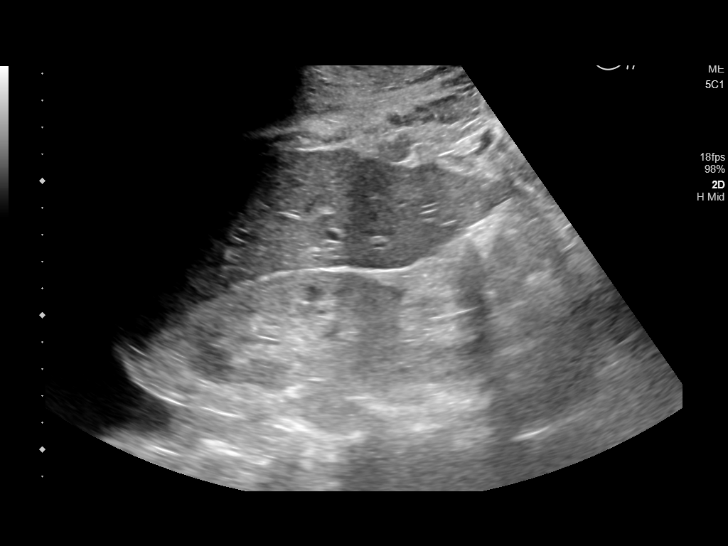
[im 3/30]
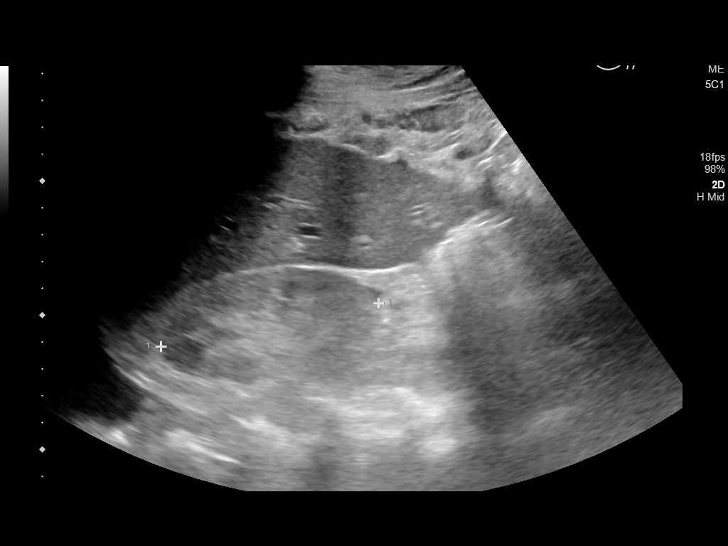
[im 5/30]
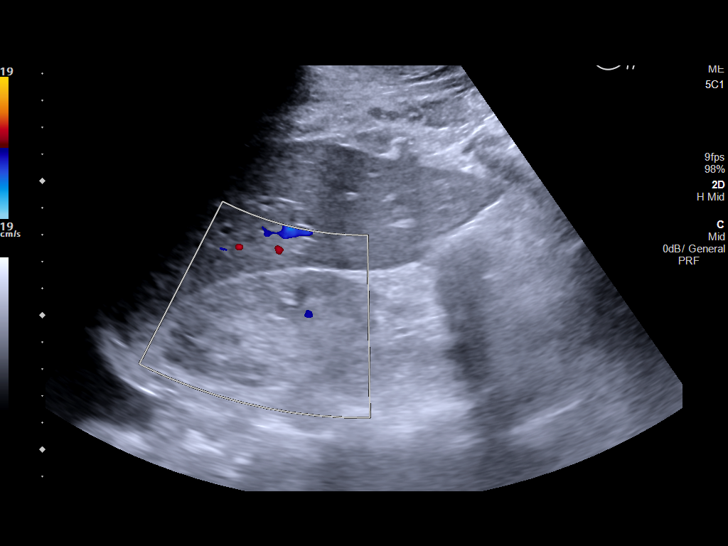
[im 8/30]
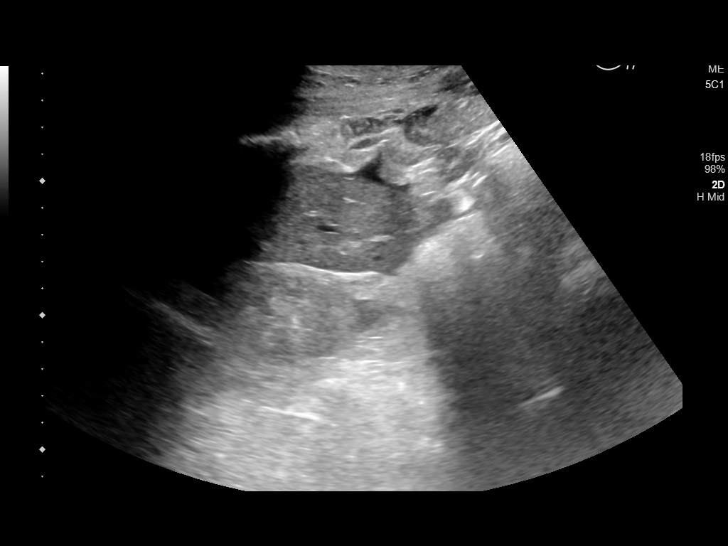
[im 10/30]
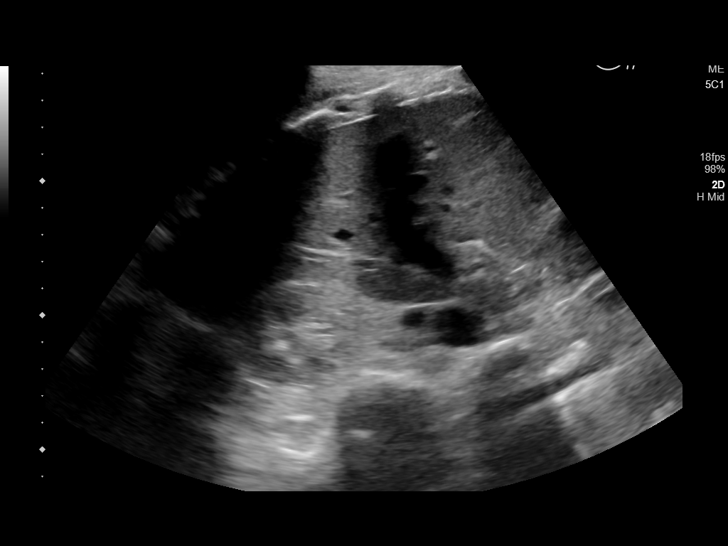
[im 11/30]
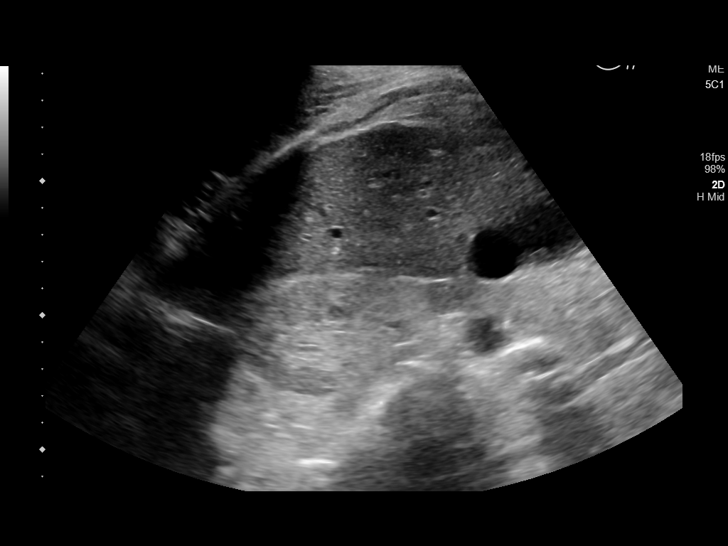
[im 14/30]
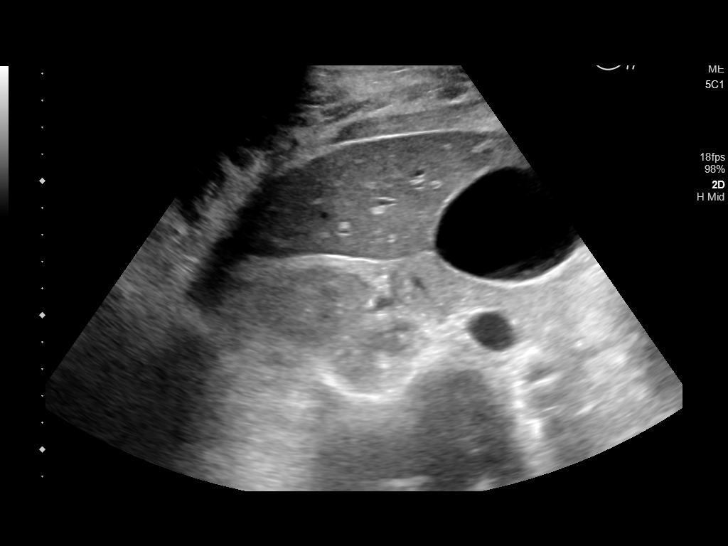
[im 16/30]
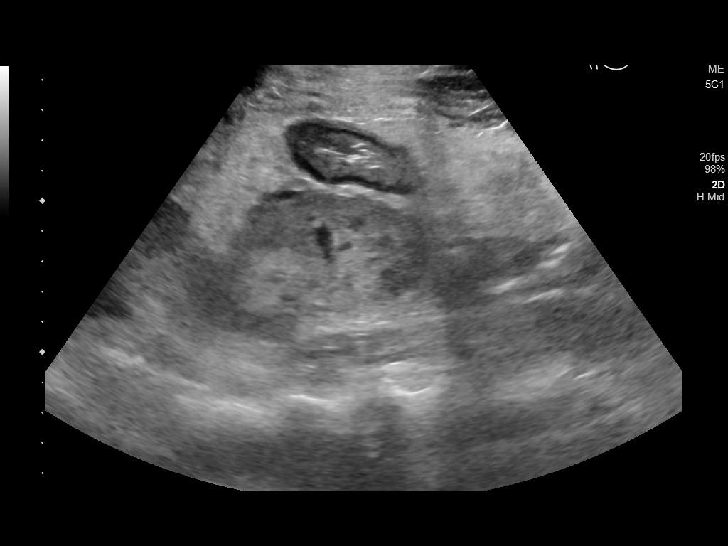
[im 19/30]
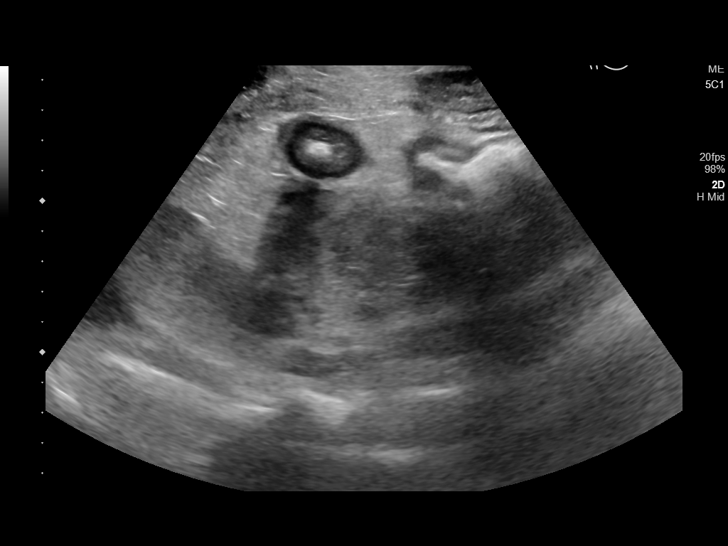
[im 20/30]
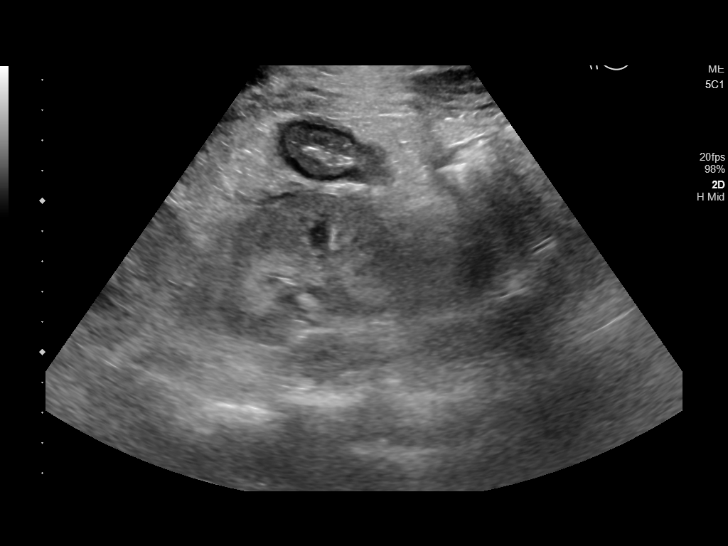
[im 22/30]
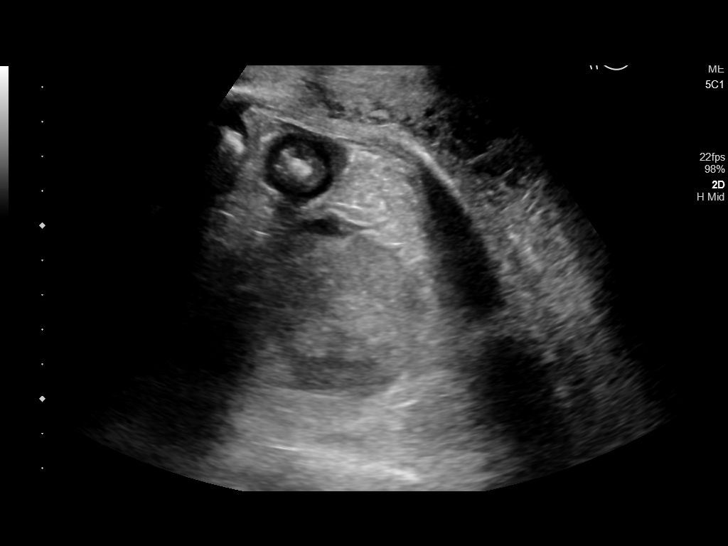
[im 25/30]
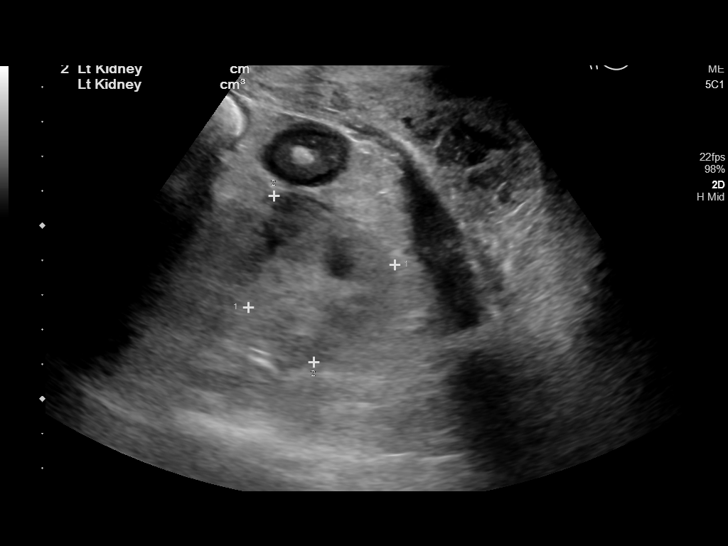
[im 27/30]
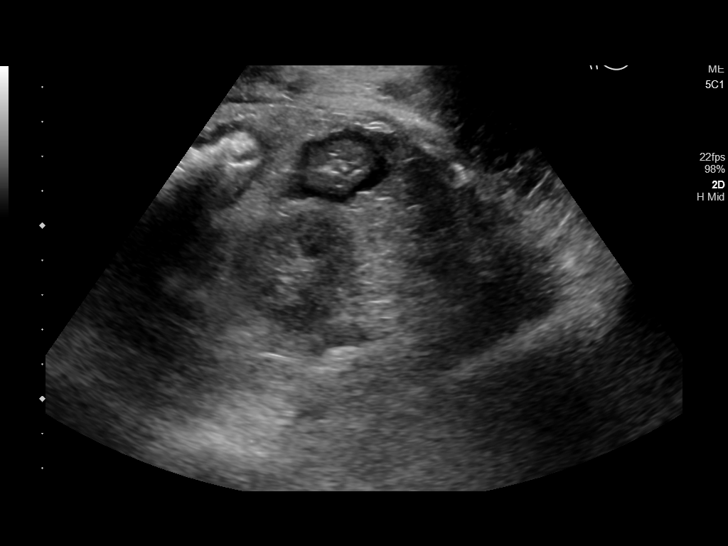
[im 30/30]
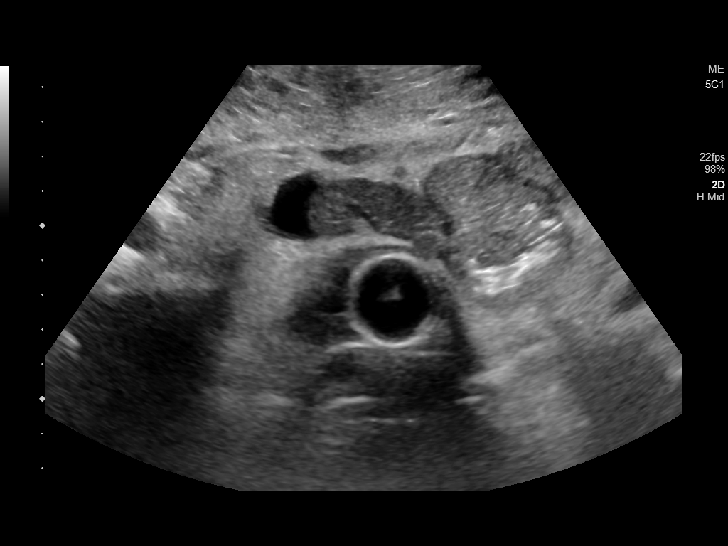

[14 of 25 positions shown; findings below may reference images not displayed]

FINDINGS: Right Kidney:

Renal measurements: 8.3 x 5.5 x 4.8 cm = volume: 115.3 mL. Increased
echogenicity. No mass or hydronephrosis.

Left Kidney:

Renal measurements: 7.9 x 4.4 x 4.9 cm = volume: 89.5 mL. Increased
echogenicity. No mass or hydronephrosis.

Bladder:

Decompressed by Foley catheter.
IMPRESSION: BILATERAL renal atrophy and increased echogenicity consistent with
medical renal disease. No obstruction or renal mass.

## 2020-01-10 IMAGING — DX PORTABLE CHEST - 1 VIEW
1 series · 1 of 1 positions shown · non-contrast
Comparison: 02/20/2019, CT Abdomen and Pelvis 06/02/2017.

CLINICAL DATA: [AGE] female with multifocal pneumonia, acute
on chronic congestive heart failure and renal failure.

EXAM:
PORTABLE CHEST 1 VIEW

[chest ap]
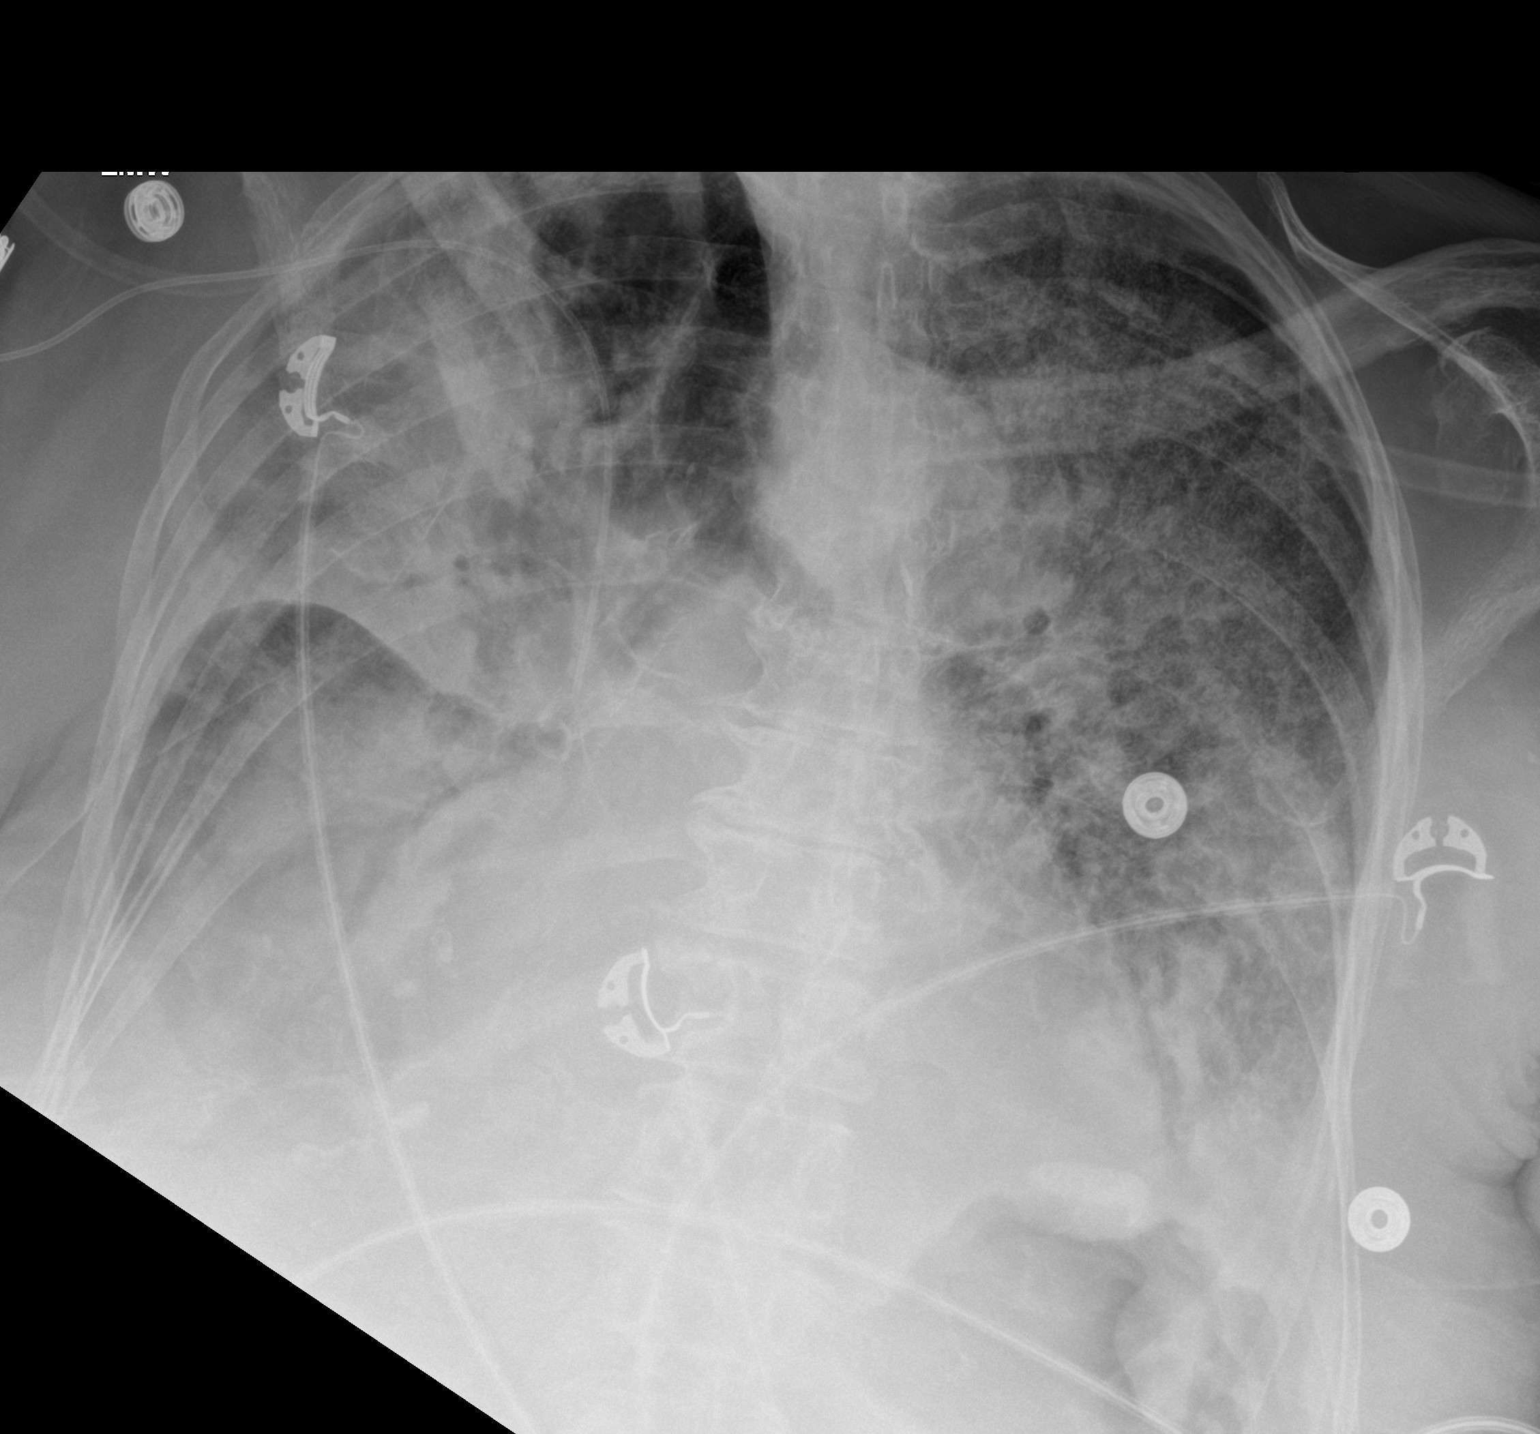

[1 of 1 positions shown; findings below may reference images not displayed]

FINDINGS: Portable AP semi upright view at 9115 hours. Stable right PICC line.
The patient is more rotated to the right.

Worsening right upper lobe consolidation and air bronchograms.
Confluent bibasilar opacity persists and obscures the diaphragm.
Coarse widespread interstitial opacity in the left mid and upper
lung persists. Visible mediastinal contours appear stable. Visible
bowel-gas pattern within normal limits.
IMPRESSION: 1. Progressive right upper pneumonia and consolidation.
2. Stable ventilation elsewhere with widespread pulmonary opacity
and suspected bilateral pleural effusions with lower lobe
collapse/consolidation.
